# Patient Record
Sex: Male | Born: 1950 | Race: White | Hispanic: No | Marital: Single | Smoking: Former smoker
Health system: Southern US, Community
[De-identification: ages and names within clinical notes are randomized; demographics above are authoritative.]

## PROBLEM LIST (undated history)

## (undated) DIAGNOSIS — I10 Essential (primary) hypertension: Secondary | ICD-10-CM

## (undated) DIAGNOSIS — M25569 Pain in unspecified knee: Secondary | ICD-10-CM

## (undated) DIAGNOSIS — G8929 Other chronic pain: Secondary | ICD-10-CM

## (undated) DIAGNOSIS — M549 Dorsalgia, unspecified: Secondary | ICD-10-CM

## (undated) DIAGNOSIS — M5416 Radiculopathy, lumbar region: Secondary | ICD-10-CM

## (undated) HISTORY — PX: SPLENECTOMY: SUR1306

## (undated) HISTORY — PX: BACK SURGERY: SHX140

---

## 2010-02-01 ENCOUNTER — Emergency Department (HOSPITAL_COMMUNITY): Admission: EM | Admit: 2010-02-01 | Discharge: 2010-02-01 | Payer: Self-pay | Admitting: Emergency Medicine

## 2010-02-02 ENCOUNTER — Ambulatory Visit (HOSPITAL_COMMUNITY): Admission: RE | Admit: 2010-02-02 | Discharge: 2010-02-02 | Payer: Self-pay | Admitting: Emergency Medicine

## 2010-07-28 LAB — PROTIME-INR
INR: 0.99 (ref 0.00–1.49)
Prothrombin Time: 13.3 seconds (ref 11.6–15.2)

## 2010-07-28 LAB — CBC
HCT: 38.7 % — ABNORMAL LOW (ref 39.0–52.0)
Hemoglobin: 13.5 g/dL (ref 13.0–17.0)
MCH: 34.3 pg — ABNORMAL HIGH (ref 26.0–34.0)
MCHC: 34.8 g/dL (ref 30.0–36.0)
MCV: 98.6 fL (ref 78.0–100.0)
Platelets: 182 10*3/uL (ref 150–400)
RBC: 3.93 MIL/uL — ABNORMAL LOW (ref 4.22–5.81)
RDW: 13.3 % (ref 11.5–15.5)
WBC: 7 K/uL (ref 4.0–10.5)

## 2010-07-28 LAB — APTT: aPTT: 28 s (ref 24–37)

## 2011-08-18 ENCOUNTER — Encounter (HOSPITAL_COMMUNITY): Payer: Self-pay

## 2011-08-18 ENCOUNTER — Emergency Department (HOSPITAL_COMMUNITY): Payer: PRIVATE HEALTH INSURANCE

## 2011-08-18 ENCOUNTER — Emergency Department (HOSPITAL_COMMUNITY)
Admission: EM | Admit: 2011-08-18 | Discharge: 2011-08-18 | Disposition: A | Discharge status: Home / Self Care | Payer: PRIVATE HEALTH INSURANCE | Attending: Emergency Medicine | Admitting: Emergency Medicine

## 2011-08-18 DIAGNOSIS — M25462 Effusion, left knee: Secondary | ICD-10-CM

## 2011-08-18 DIAGNOSIS — M25469 Effusion, unspecified knee: Secondary | ICD-10-CM | POA: Insufficient documentation

## 2011-08-18 DIAGNOSIS — M1710 Unilateral primary osteoarthritis, unspecified knee: Secondary | ICD-10-CM | POA: Insufficient documentation

## 2011-08-18 DIAGNOSIS — M1712 Unilateral primary osteoarthritis, left knee: Secondary | ICD-10-CM

## 2011-08-18 DIAGNOSIS — IMO0002 Reserved for concepts with insufficient information to code with codable children: Secondary | ICD-10-CM | POA: Insufficient documentation

## 2011-08-18 MED ORDER — HYDROCODONE-ACETAMINOPHEN 5-325 MG PO TABS: 1.0000 | ORAL_TABLET | ORAL | Status: AC | PRN

## 2011-08-18 NOTE — Discharge Instructions (Signed)

## 2011-08-18 NOTE — ED Notes (Signed)
Pt presents with left knee pain starting today. Pt denies injury.

## 2011-08-19 NOTE — ED Provider Notes (Signed)
History     CSN: [id number]  Arrival date & time 08/18/11  1633   First MD Initiated Contact with Patient 08/18/11 1652      Chief Complaint  Patient presents with  . Knee Pain    (Consider location/radiation/quality/duration/timing/severity/associated sxs/prior treatment) HPI Comments: Patient has been told he has arthritis in his knees.  He believes he has increased pain due to the weather change yesterday.    Patient is a 61 y.o. male presenting with knee pain. The history is provided by the patient.  Knee Pain This is a recurrent problem. The current episode started today. The problem occurs constantly. The problem has been unchanged. Associated symptoms include arthralgias and joint swelling. Pertinent negatives include no abdominal pain, chest pain, chills, congestion, fever, headaches, myalgias, nausea, neck pain, numbness, rash, sore throat or weakness. The symptoms are aggravated by walking and bending. He has tried NSAIDs for the symptoms. The treatment provided no relief.    History reviewed. No pertinent past medical history.  History reviewed. No pertinent past surgical history.  No family history on file.  History  Substance Use Topics  . Smoking status: Never Smoker   . Smokeless tobacco: Not on file  . Alcohol Use: No      Review of Systems  Constitutional: Negative for fever and chills.  HENT: Negative for congestion, sore throat and neck pain.   Eyes: Negative.   Respiratory: Negative for chest tightness and shortness of breath.   Cardiovascular: Negative for chest pain.  Gastrointestinal: Negative for nausea and abdominal pain.  Genitourinary: Negative.   Musculoskeletal: Positive for joint swelling and arthralgias. Negative for myalgias.  Skin: Negative.  Negative for rash and wound.  Neurological: Negative for dizziness, weakness, light-headedness, numbness and headaches.  Hematological: Negative.   Psychiatric/Behavioral: Negative.      Allergies  Review of patient's allergies indicates no known allergies.  Home Medications   Current Outpatient Rx  Name Route Sig Dispense Refill  . IBUPROFEN 200 MG PO TABS Oral Take 400 mg by mouth once as needed. AT BEDTIME FOR PAIN    . ONE-A-DAY MENS PO Oral Take 1 tablet by mouth once a week.    Marland Kitchen HYDROCODONE-ACETAMINOPHEN 5-325 MG PO TABS Oral Take 1 tablet by mouth every 4 (four) hours as needed for pain. 20 tablet 0    BP 154/97  Pulse 62  Temp(Src) 97.5 F (36.4 C) (Oral)  Resp 20  Ht 6\' 6"  (1.981 m)  Wt 175 lb (79.379 kg)  BMI 20.22 kg/m2  SpO2 98%  Physical Exam  Nursing note and vitals reviewed. Constitutional: He is oriented to person, place, and time. He appears well-developed and well-nourished.  HENT:  Head: Normocephalic.  Eyes: Conjunctivae are normal.  Neck: Normal range of motion.  Cardiovascular: Normal rate and intact distal pulses.  Exam reveals no decreased pulses.   Pulses:      Dorsalis pedis pulses are 2+ on the right side, and 2+ on the left side.       Posterior tibial pulses are 2+ on the right side, and 2+ on the left side.  Pulmonary/Chest: Effort normal.  Musculoskeletal: He exhibits edema and tenderness.       Left knee: He exhibits swelling and bony tenderness. He exhibits normal range of motion, no effusion, no deformity, no erythema, no LCL laxity, normal meniscus and no MCL laxity. tenderness found. Lateral joint line tenderness noted.  Neurological: He is alert and oriented to person, place, and time. No sensory  deficit.  Skin: Skin is warm, dry and intact.    ED Course  Procedures (including critical care time)  Labs Reviewed - No data to display Dg Knee Complete 4 Views Left  08/18/2011  *RADIOLOGY REPORT*  Clinical Data: Left knee pain, swelling  LEFT KNEE - COMPLETE 4+ VIEW  Comparison: None.  Findings: Normal alignment.  Tricompartmental osteoarthritis noted, most pronounced in the lateral compartment.  No acute fracture.  Lateral view demonstrates a small joint effusion superior to the patella.  IMPRESSION: Moderate to severe tricompartmental osteoarthritis.  Small joint effusion.  Negative for acute fracture.  Original Report Authenticated By: Judie Petit. Ruel Favors, M.D.     1. Osteoarthritis of left knee   2. Effusion of knee joint, left       MDM  Hydrocodone prescribed.  Referral to Dr. Romeo Apple for further management of sx.        Candis Musa, PA 08/19/11 2302

## 2011-08-19 NOTE — ED Provider Notes (Signed)
Medical screening examination/treatment/procedure(s) were performed by non-physician practitioner and as supervising physician I was immediately available for consultation/collaboration.   Taquisha Phung, MD 08/19/11 2339 

## 2012-01-17 ENCOUNTER — Encounter (HOSPITAL_COMMUNITY): Payer: Self-pay | Admitting: Emergency Medicine

## 2012-01-17 ENCOUNTER — Emergency Department (HOSPITAL_COMMUNITY)
Admission: EM | Admit: 2012-01-17 | Discharge: 2012-01-17 | Disposition: A | Discharge status: Home / Self Care | Payer: PRIVATE HEALTH INSURANCE | Attending: Emergency Medicine | Admitting: Emergency Medicine

## 2012-01-17 ENCOUNTER — Emergency Department (HOSPITAL_COMMUNITY): Payer: PRIVATE HEALTH INSURANCE

## 2012-01-17 DIAGNOSIS — G8929 Other chronic pain: Secondary | ICD-10-CM | POA: Insufficient documentation

## 2012-01-17 DIAGNOSIS — M25569 Pain in unspecified knee: Secondary | ICD-10-CM | POA: Insufficient documentation

## 2012-01-17 DIAGNOSIS — M1710 Unilateral primary osteoarthritis, unspecified knee: Secondary | ICD-10-CM

## 2012-01-17 DIAGNOSIS — M25562 Pain in left knee: Secondary | ICD-10-CM

## 2012-01-17 MED ORDER — NAPROXEN 500 MG PO TABS: 500.0000 mg | ORAL_TABLET | Freq: Two times a day (BID) | ORAL | Status: AC

## 2012-01-17 MED ORDER — OXYCODONE-ACETAMINOPHEN 5-325 MG PO TABS: 1.0000 | ORAL_TABLET | ORAL | Status: AC | PRN

## 2012-01-17 MED ORDER — NAPROXEN 250 MG PO TABS
500.0000 mg | ORAL_TABLET | Freq: Once | ORAL | Status: AC
  Administered 2012-01-17: 500 mg via ORAL
  Filled 2012-01-17: qty 2

## 2012-01-17 MED ORDER — OXYCODONE-ACETAMINOPHEN 5-325 MG PO TABS
1.0000 | ORAL_TABLET | Freq: Once | ORAL | Status: AC
  Administered 2012-01-17: 1 via ORAL
  Filled 2012-01-17: qty 1

## 2012-01-17 NOTE — ED Notes (Signed)
Patient c/o left knee pain since yesterday.  Denies any injury or trauma to left knee.  Patient states hurts to put weight on it.

## 2012-01-17 NOTE — ED Notes (Signed)
Pt presents with left knee pain and noted swelling. Pt states bends down a lot at work and crawls around on knees. Left knee x-ray resulted. NAD noted.

## 2012-01-17 NOTE — ED Provider Notes (Signed)
History     CSN: [id number]  Arrival date & time 01/17/12  1854   First MD Initiated Contact with Patient 01/17/12 1914      Chief Complaint  Patient presents with  . Knee Pain    (Consider location/radiation/quality/duration/timing/severity/associated sxs/prior treatment) HPI Comments: Patient with hx of arthritis of his knee and chronic knee pain, c/o worsening pain to the left knee since the day prior to ed arrival.  States he does a lot of bending and walking at his job and noticed his knee also began swelling after work.  He denies known injury, recent illness or fever,  or recent fall. He also denies back or hip pain, calf pain, discoloration or numbness   The history is provided by the patient.    History reviewed. No pertinent past medical history.  History reviewed. No pertinent past surgical history.  No family history on file.  History  Substance Use Topics  . Smoking status: Never Smoker   . Smokeless tobacco: Not on file  . Alcohol Use: No      Review of Systems  Constitutional: Negative for fever and chills.  Genitourinary: Negative for dysuria and difficulty urinating.  Musculoskeletal: Positive for joint swelling, arthralgias and gait problem. Negative for back pain.  Skin: Negative for color change and wound.  Neurological: Negative for weakness and numbness.  All other systems reviewed and are negative.    Allergies  Review of patient's allergies indicates no known allergies.  Home Medications   Current Outpatient Rx  Name Route Sig Dispense Refill  . IBUPROFEN 200 MG PO TABS Oral Take 400 mg by mouth once as needed. AT BEDTIME FOR PAIN    . ONE-A-DAY MENS PO Oral Take 1 tablet by mouth once a week.      BP 140/79  Pulse 86  Temp 97.5 F (36.4 C) (Oral)  Resp 16  Ht 6\' 6"  (1.981 m)  Wt 265 lb (120.203 kg)  BMI 30.62 kg/m2  SpO2 98%  Physical Exam  Nursing note and vitals reviewed. Constitutional: He is oriented to person, place, and  time. He appears well-developed and well-nourished. No distress.  Cardiovascular: Normal rate, regular rhythm, normal heart sounds and intact distal pulses.   Pulmonary/Chest: Effort normal and breath sounds normal.  Musculoskeletal: He exhibits edema and tenderness.       Left knee: He exhibits swelling and bony tenderness. He exhibits normal range of motion, no effusion, no ecchymosis, no deformity, no laceration, no erythema and no LCL laxity. tenderness found. Medial joint line and lateral joint line tenderness noted.       Legs:      ttp of the anterior left knee.  No erythema, bruising, step-offs, excessive warmth or deformity.  Pain is reproduced with full flexion or full extension.  Slight crepitus with extension.  DP pulses brisk, sensation intact.,  No calf pain or swelling.  Neg Homan's sign  Neurological: He is alert and oriented to person, place, and time. He exhibits normal muscle tone. Coordination normal.  Skin: Skin is warm and dry. No erythema.    ED Course  Procedures (including critical care time)  Labs Reviewed - No data to display Dg Knee Complete 4 Views Left  01/17/2012  *RADIOLOGY REPORT*  Clinical Data: Left knee pain for 2 days.  No known injury.  LEFT KNEE - COMPLETE 4+ VIEW  Comparison: Radiographs 08/18/2011.  Findings: Advanced tricompartmental degenerative changes are again noted with bone on bone apposition laterally.  There is  a small knee joint effusion.  No acute fracture or dislocation is seen.  IMPRESSION: Stable examination with tricompartmental degenerative changes and joint effusion.  No acute osseous findings.   Original Report Authenticated By: Gerrianne Scale, M.D.         MDM    Previous ED chart reviewed.  Pt seen here previously for same.   Knee immob applied.  Pain improved.  Remains NV intact.  Pt agrees to close f/u with Dr. Romeo Apple.  No excessive warmth, erythema, step-offs or obvious effusion.  Likely exacerbation of OA of the knee.   Doubt septic joint.  Prescribed: Naprosyn Norco #20     Tylar Amborn L. [place] Rios, [place] 01/18/12 1318

## 2012-01-18 NOTE — ED Provider Notes (Signed)
Medical screening examination/treatment/procedure(s) were performed by non-physician practitioner and as supervising physician I was immediately available for consultation/collaboration.  Peyson Delao T Yazlyn Wentzel, MD 01/18/12 1504 

## 2013-04-11 ENCOUNTER — Encounter (HOSPITAL_COMMUNITY): Payer: Self-pay | Admitting: Emergency Medicine

## 2013-04-11 ENCOUNTER — Emergency Department (HOSPITAL_COMMUNITY): Payer: PRIVATE HEALTH INSURANCE

## 2013-04-11 ENCOUNTER — Emergency Department (HOSPITAL_COMMUNITY)
Admission: EM | Admit: 2013-04-11 | Discharge: 2013-04-11 | Disposition: A | Discharge status: Home / Self Care | Payer: PRIVATE HEALTH INSURANCE | Attending: Emergency Medicine | Admitting: Emergency Medicine

## 2013-04-11 DIAGNOSIS — M5416 Radiculopathy, lumbar region: Secondary | ICD-10-CM

## 2013-04-11 DIAGNOSIS — Z9889 Other specified postprocedural states: Secondary | ICD-10-CM | POA: Insufficient documentation

## 2013-04-11 DIAGNOSIS — IMO0002 Reserved for concepts with insufficient information to code with codable children: Secondary | ICD-10-CM | POA: Insufficient documentation

## 2013-04-11 DIAGNOSIS — Z87891 Personal history of nicotine dependence: Secondary | ICD-10-CM | POA: Insufficient documentation

## 2013-04-11 MED ORDER — CYCLOBENZAPRINE HCL 5 MG PO TABS: 5.0000 mg | ORAL_TABLET | Freq: Three times a day (TID) | ORAL | Status: DC | PRN

## 2013-04-11 MED ORDER — HYDROMORPHONE HCL PF 1 MG/ML IJ SOLN
1.0000 mg | Freq: Once | INTRAMUSCULAR | Status: AC
  Administered 2013-04-11: 1 mg via INTRAMUSCULAR
  Filled 2013-04-11: qty 1

## 2013-04-11 MED ORDER — OXYCODONE-ACETAMINOPHEN 5-325 MG PO TABS: 1.0000 | ORAL_TABLET | ORAL | Status: DC | PRN

## 2013-04-11 NOTE — ED Notes (Signed)
Pt states pain in left lower back started 1 x week prior and was initially mild but has progressed to more intense pain and is now radiating towards left anterior thigh.  NAD, AO.

## 2013-04-11 NOTE — ED Provider Notes (Signed)
CSN: [id number]     Arrival date & time 04/11/13  1245 History   First MD Initiated Contact with Patient 04/11/13 1438     Chief Complaint  Patient presents with  . Back Pain   (Consider location/radiation/quality/duration/timing/severity/associated sxs/prior Treatment) HPI Comments: Chris Hicks is a 62 y.o. Male with a one week history of low back pain with radiation through his left buttock and wrapping around his anterior thigh.  His pain was mild with gradually progressive worsening the past several days.  He has occasional low back pain with a history of lumbar disk surgery 10 years ago at [facility] which generally improves with otc pain relievers.  Ibuprofen has not helped today.  He denies new injury.  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  He denies weakness or numbness in his lower extremities with the pain radiating to his mid left thigh.  He denies urinary or fecal incontinence or retention.     The history is provided by the patient.    History reviewed. No pertinent past medical history. Past Surgical History  Procedure Laterality Date  . Back surgery     History reviewed. No pertinent family history. History  Substance Use Topics  . Smoking status: Former Smoker -- 20 years  . Smokeless tobacco: Not on file  . Alcohol Use: No    Review of Systems  Constitutional: Negative for fever.  Respiratory: Negative for shortness of breath.   Cardiovascular: Negative for chest pain and leg swelling.  Gastrointestinal: Negative for abdominal pain, constipation and abdominal distention.  Genitourinary: Negative for dysuria, urgency, frequency, flank pain and difficulty urinating.  Musculoskeletal: Positive for back pain. Negative for gait problem and joint swelling.  Skin: Negative for rash.  Neurological: Negative for weakness and numbness.    Allergies  Review of patient's allergies indicates no  known allergies.  Home Medications   Current Outpatient Rx  Name  Route  Sig  Dispense  Refill  . ibuprofen (ADVIL,MOTRIN) 200 MG tablet   Oral   Take 400 mg by mouth once as needed. AT BEDTIME FOR PAIN         . Multiple Vitamin (ONE-A-DAY MENS PO)   Oral   Take 1 tablet by mouth once a week.         . cyclobenzaprine (FLEXERIL) 5 MG tablet   Oral   Take 1 tablet (5 mg total) by mouth 3 (three) times daily as needed for muscle spasms.   15 tablet   0   . oxyCODONE-acetaminophen (PERCOCET/ROXICET) 5-325 MG per tablet   Oral   Take 1 tablet by mouth every 4 (four) hours as needed for severe pain.   24 tablet   0    BP 146/89  Pulse 72  Temp(Src) 97.9 F (36.6 C) (Oral)  Resp 16  Ht 6\' 6"  (1.981 m)  Wt 265 lb (120.203 kg)  BMI 30.63 kg/m2  SpO2 97% Physical Exam  Nursing note and vitals reviewed. Constitutional: He appears well-developed and well-nourished.  HENT:  Head: Normocephalic.  Eyes: Conjunctivae are normal.  Neck: Normal range of motion. Neck supple.  Cardiovascular: Normal rate and intact distal pulses.   Pedal pulses normal.  Pulmonary/Chest: Effort normal.  Abdominal: Soft. Bowel sounds are normal. He exhibits no distension and no mass.  Musculoskeletal: Normal range of motion. He exhibits tenderness. He exhibits no edema.       Lumbar back: He  exhibits tenderness. He exhibits no bony tenderness, no swelling, no edema and no spasm.       Back:  Neurological: He is alert. He has normal strength. He displays no atrophy and no tremor. No sensory deficit. Gait normal.  Reflex Scores:      Patellar reflexes are 2+ on the right side and 2+ on the left side.      Achilles reflexes are 2+ on the right side and 2+ on the left side. No strength deficit noted in hip and knee flexor and extensor muscle groups.  Ankle flexion and extension intact.  Skin: Skin is warm and dry.  Psychiatric: He has a normal mood and affect.    ED Course  Procedures  (including critical care time) Labs Review Labs Reviewed - No data to display Imaging Review Dg Lumbar Spine Complete  04/11/2013   CLINICAL DATA:  One week history of left lower back pain now radiating into anterior thigh  EXAM: LUMBAR SPINE - COMPLETE 4+ VIEW  COMPARISON:  None.  FINDINGS: Frontal, lateral and bilateral oblique radiographs of the lumbar spine demonstrate no acute fracture or malalignment. Chronic appearing compression fracture of T11 with greater than 50% height loss anteriorly in focal kyphosis. Multilevel degenerative disc disease is noted. Facet arthropathy at L4-L5 and L5-S1. Prominence right lateral osteophyte formation at L1-L2. No evidence of anterolisthesis. Visualized bowel gas pattern is unremarkable. Atherosclerotic calcifications noted in the aorta.  IMPRESSION: 1. Chronic appearing compression fracture of T11 with greater than 50% height loss anteriorly. 2. Multilevel degenerative disc disease and lower lumbar facet arthropathy. 3. Aortic atherosclerosis.   Electronically Signed   By: Malachy Moan M.D.   On: 04/11/2013 15:22    EKG Interpretation   None       MDM   1. Left lumbar radiculopathy    Patients labs and/or radiological studies were viewed and considered during the medical decision making and disposition process. Pt reports thoracic compression fracture is known and an old injury.  He was given dilaudid 1 mg IM with significant improvement in pain.  He was prescribed oxycodone, flexeril.  Encouraged heating pad tx.  Recheck by pcp if not improved over the next 5 day.    No neuro deficit on exam or by history to suggest emergent or surgical presentation.  Also discussed worsened sx that should prompt immediate re-evaluation including distal weakness, bowel/bladder retention/incontinence.          Burgess Amor, PA-C 04/11/13 1659

## 2013-04-11 NOTE — ED Notes (Signed)
Pain lt side of low back with radiation down lt leg,  Onset 1 week ago, but worse last few days.  Alert.

## 2013-04-13 NOTE — ED Provider Notes (Signed)
Medical screening examination/treatment/procedure(s) were performed by non-physician practitioner and as supervising physician I was immediately available for consultation/collaboration.  EKG Interpretation   None        Shavaun Osterloh, MD 04/13/13 0757 

## 2013-04-25 ENCOUNTER — Emergency Department (HOSPITAL_COMMUNITY)
Admission: EM | Admit: 2013-04-25 | Discharge: 2013-04-25 | Disposition: A | Discharge status: Home / Self Care | Payer: PRIVATE HEALTH INSURANCE | Attending: Emergency Medicine | Admitting: Emergency Medicine

## 2013-04-25 ENCOUNTER — Encounter (HOSPITAL_COMMUNITY): Payer: Self-pay | Admitting: Emergency Medicine

## 2013-04-25 DIAGNOSIS — Z79899 Other long term (current) drug therapy: Secondary | ICD-10-CM | POA: Insufficient documentation

## 2013-04-25 DIAGNOSIS — R609 Edema, unspecified: Secondary | ICD-10-CM

## 2013-04-25 DIAGNOSIS — Z87891 Personal history of nicotine dependence: Secondary | ICD-10-CM | POA: Insufficient documentation

## 2013-04-25 DIAGNOSIS — R350 Frequency of micturition: Secondary | ICD-10-CM | POA: Insufficient documentation

## 2013-04-25 DIAGNOSIS — N419 Inflammatory disease of prostate, unspecified: Secondary | ICD-10-CM | POA: Insufficient documentation

## 2013-04-25 LAB — BASIC METABOLIC PANEL
BUN: 20 mg/dL (ref 6–23)
CO2: 24 mEq/L (ref 19–32)
Chloride: 102 mEq/L (ref 96–112)
Creatinine, Ser: 0.82 mg/dL (ref 0.50–1.35)
GFR calc Af Amer: >90 mL/min (ref >90)

## 2013-04-25 LAB — URINALYSIS, ROUTINE W REFLEX MICROSCOPIC
Bilirubin Urine: NEGATIVE
Glucose, UA: NEGATIVE mg/dL
Hgb urine dipstick: NEGATIVE
Ketones, ur: NEGATIVE mg/dL
Leukocytes, UA: NEGATIVE
pH: 5.5 (ref 5.0–8.0)

## 2013-04-25 LAB — CBC WITH DIFFERENTIAL/PLATELET
Basophils Relative: 1 % (ref 0–1)
Eosinophils Relative: 2 % (ref 0–5)
HCT: 37.8 % — ABNORMAL LOW (ref 39.0–52.0)
Hemoglobin: 13 g/dL (ref 13.0–17.0)
MCHC: 34.4 g/dL (ref 30.0–36.0)
MCV: 101.1 fL — ABNORMAL HIGH (ref 78.0–100.0)
Monocytes Absolute: 1.1 10*3/uL — ABNORMAL HIGH (ref 0.1–1.0)
Monocytes Relative: 20 % — ABNORMAL HIGH (ref 3–12)
Neutro Abs: 2.7 10*3/uL (ref 1.7–7.7)

## 2013-04-25 MED ORDER — CIPROFLOXACIN HCL 500 MG PO TABS: 500.0000 mg | ORAL_TABLET | Freq: Two times a day (BID) | ORAL | Status: DC

## 2013-04-25 MED ORDER — FUROSEMIDE 20 MG PO TABS: 20.0000 mg | ORAL_TABLET | Freq: Every day | ORAL | Status: DC

## 2013-04-25 NOTE — ED Notes (Addendum)
Difficulty urinating for 2 days,  Swelling of feet intermittently for 1 year.  No fever , chills.   Has burn to rt forearm from welding.

## 2013-04-25 NOTE — ED Provider Notes (Signed)
CSN: [id number]     Arrival date & time 04/25/13  1359 History   First MD Initiated Contact with Patient 04/25/13 1416     No chief complaint on file.  (Consider location/radiation/quality/duration/timing/severity/associated sxs/prior Treatment) The history is provided by the patient.   patient's had some difficulty urinating for the last few days. States she's had some trouble going up and no pain with joint. He states he thinks the volumes are about normal. No chest pain or trouble breathing. No fevers. He states he's had some increased swelling in his legs 2. He was recently seen for back pain, he states this has improved somewhat with pain medicines muscle relaxants. No numbness or weakness. He states his left knee will occasionally give out on him, however he states this is not unusual.  History reviewed. No pertinent past medical history. Past Surgical History  Procedure Laterality Date  . Back surgery    . Splenectomy     History reviewed. No pertinent family history. History  Substance Use Topics  . Smoking status: Former Smoker -- 20 years  . Smokeless tobacco: Not on file  . Alcohol Use: No    Review of Systems  Constitutional: Negative for activity change and appetite change.  Eyes: Negative for pain.  Respiratory: Negative for chest tightness and shortness of breath.   Cardiovascular: Positive for leg swelling. Negative for chest pain.  Gastrointestinal: Negative for nausea, vomiting, abdominal pain and diarrhea.  Genitourinary: Positive for frequency and difficulty urinating. Negative for flank pain.  Musculoskeletal: Negative for back pain and neck stiffness.  Skin: Negative for rash.  Neurological: Negative for weakness, numbness and headaches.  Psychiatric/Behavioral: Negative for behavioral problems.    Allergies  Review of patient's allergies indicates no known allergies.  Home Medications   Current Outpatient Rx  Name  Route  Sig  Dispense  Refill  .  acetaminophen (TYLENOL) 650 MG CR tablet   Oral   Take 1,950 mg by mouth daily.         . cyclobenzaprine (FLEXERIL) 5 MG tablet   Oral   Take 1 tablet (5 mg total) by mouth 3 (three) times daily as needed for muscle spasms.   15 tablet   0   . oxyCODONE-acetaminophen (PERCOCET/ROXICET) 5-325 MG per tablet   Oral   Take 1 tablet by mouth every 4 (four) hours as needed for severe pain.   24 tablet   0    BP 145/96  Pulse 82  Temp(Src) 97.6 F (36.4 C) (Oral)  Resp 18  Ht 6\' 6"  (1.981 m)  Wt 265 lb (120.203 kg)  BMI 30.63 kg/m2  SpO2 99% Physical Exam  Nursing note and vitals reviewed. Constitutional: He is oriented to person, place, and time. He appears well-developed and well-nourished.  HENT:  Head: Normocephalic and atraumatic.  Eyes: EOM are normal. Pupils are equal, round, and reactive to light.  Neck: Normal range of motion. Neck supple.  Cardiovascular: Normal rate, regular rhythm and normal heart sounds.   No murmur heard. Pulmonary/Chest: Effort normal and breath sounds normal.  Abdominal: Soft. Bowel sounds are normal. He exhibits no distension and no mass. There is no tenderness. There is no rebound and no guarding.  Genitourinary:  Perineal tenderness.  Musculoskeletal: Normal range of motion. He exhibits edema.  Bilateral lower extremity pitting edema. Some excoriations from scratching on his bilateral lower legs.  Neurological: He is alert and oriented to person, place, and time. No cranial nerve deficit.  Skin: Skin is  warm and dry.  Psychiatric: He has a normal mood and affect.    ED Course  Procedures (including critical care time) Labs Review Labs Reviewed  URINALYSIS, ROUTINE W REFLEX MICROSCOPIC  CBC WITH DIFFERENTIAL  BASIC METABOLIC PANEL   Imaging Review No results found.  EKG Interpretation   None       MDM  No diagnosis found. Patient with some dysuria. Peroneal tenderness. May need treatment as prostatitis. Blood work is  pending at this time, as is the urine. Care will be turned over to Dr. Rosalia Hammers. Patient may need short dose of Lasix for some of his peripheral edema. He does not appear to be in a severe CHF.    Juliet Rude. Rubin Payor, MD 04/25/13 [phone]

## 2014-07-02 ENCOUNTER — Encounter (HOSPITAL_COMMUNITY): Payer: Self-pay | Admitting: Emergency Medicine

## 2014-07-02 ENCOUNTER — Emergency Department (HOSPITAL_COMMUNITY)
Admission: EM | Admit: 2014-07-02 | Discharge: 2014-07-03 | Disposition: A | Discharge status: Home / Self Care | Payer: 59 | Attending: Emergency Medicine | Admitting: Emergency Medicine

## 2014-07-02 ENCOUNTER — Emergency Department (HOSPITAL_COMMUNITY): Payer: 59

## 2014-07-02 DIAGNOSIS — Y9289 Other specified places as the place of occurrence of the external cause: Secondary | ICD-10-CM | POA: Diagnosis not present

## 2014-07-02 DIAGNOSIS — Y9389 Activity, other specified: Secondary | ICD-10-CM | POA: Diagnosis not present

## 2014-07-02 DIAGNOSIS — S92512A Displaced fracture of proximal phalanx of left lesser toe(s), initial encounter for closed fracture: Secondary | ICD-10-CM | POA: Diagnosis not present

## 2014-07-02 DIAGNOSIS — X58XXXA Exposure to other specified factors, initial encounter: Secondary | ICD-10-CM | POA: Diagnosis not present

## 2014-07-02 DIAGNOSIS — Z87891 Personal history of nicotine dependence: Secondary | ICD-10-CM | POA: Diagnosis not present

## 2014-07-02 DIAGNOSIS — S93402A Sprain of unspecified ligament of left ankle, initial encounter: Secondary | ICD-10-CM | POA: Diagnosis not present

## 2014-07-02 DIAGNOSIS — G8929 Other chronic pain: Secondary | ICD-10-CM | POA: Diagnosis not present

## 2014-07-02 DIAGNOSIS — M8739 Other secondary osteonecrosis, multiple sites: Secondary | ICD-10-CM | POA: Insufficient documentation

## 2014-07-02 DIAGNOSIS — Y998 Other external cause status: Secondary | ICD-10-CM | POA: Diagnosis not present

## 2014-07-02 DIAGNOSIS — S92502A Displaced unspecified fracture of left lesser toe(s), initial encounter for closed fracture: Secondary | ICD-10-CM

## 2014-07-02 DIAGNOSIS — Z79899 Other long term (current) drug therapy: Secondary | ICD-10-CM | POA: Insufficient documentation

## 2014-07-02 DIAGNOSIS — S99912A Unspecified injury of left ankle, initial encounter: Secondary | ICD-10-CM | POA: Diagnosis present

## 2014-07-02 HISTORY — DX: Pain in unspecified knee: M25.569

## 2014-07-02 HISTORY — DX: Dorsalgia, unspecified: M54.9

## 2014-07-02 HISTORY — DX: Radiculopathy, lumbar region: M54.16

## 2014-07-02 HISTORY — DX: Other chronic pain: G89.29

## 2014-07-02 NOTE — ED Provider Notes (Signed)
CSN: [id number]     Arrival date & time 07/02/14  2159 History   First MD Initiated Contact with Patient 07/02/14 2318     No chief complaint on file.    (Consider location/radiation/quality/duration/timing/severity/associated sxs/prior Treatment) HPI   Chris Hicks is a [age] y.o. male who presents to the Emergency Department complaining of left ankle and foot pain that began suddenly this afternoon when his foot went through a wooden board.  He complains of continued pain, and swelling of the foot and lateral ankle.  Pain is worse with weight bearing.  He has not tried any therapies prior to ED arrival.  He denies pain or swelling above the ankle.   Past Medical History  Diagnosis Date  . Chronic knee pain   . Chronic back pain   . Lumbar radiculopathy    Past Surgical History  Procedure Laterality Date  . Back surgery    . Splenectomy     History reviewed. No pertinent family history. History  Substance Use Topics  . Smoking status: Former Smoker -- 20 years  . Smokeless tobacco: Not on file  . Alcohol Use: No    Review of Systems  Constitutional: Negative for fever and chills.  Musculoskeletal: Positive for joint swelling and arthralgias (left ankle and foot pain).  Skin: Negative for color change and wound.  Neurological: Negative for weakness, numbness and headaches.  All other systems reviewed and are negative.     Allergies  Review of patient's allergies indicates no known allergies.  Home Medications   Prior to Admission medications   Medication Sig Start Date End Date Taking? Authorizing Provider  acetaminophen (TYLENOL) 650 MG CR tablet Take 1,950 mg by mouth daily.    Historical Provider, MD  ciprofloxacin (CIPRO) 500 MG tablet Take 1 tablet (500 mg total) by mouth 2 (two) times daily. 04/25/13   Juliet RudeNathan R. Pickering, MD  cyclobenzaprine (FLEXERIL) 5 MG tablet Take 1 tablet (5 mg total) by mouth 3 (three) times daily as needed for muscle spasms. 04/11/13    Burgess AmorJulie Idol, PA-C  furosemide (LASIX) 20 MG tablet Take 1 tablet (20 mg total) by mouth daily. 04/25/13   Juliet RudeNathan R. Pickering, MD  oxyCODONE-acetaminophen (PERCOCET/ROXICET) 5-325 MG per tablet Take 1 tablet by mouth every 4 (four) hours as needed for severe pain. 04/11/13   Burgess AmorJulie Idol, PA-C   BP 116/66 mmHg  Pulse 60  Temp(Src) 97.8 F (36.6 C) (Oral)  Resp 20  Ht 6\' 6"  (1.981 m)  Wt 266 lb (120.657 kg)  BMI 30.75 kg/m2  SpO2 97% Physical Exam  Constitutional: He is oriented to person, place, and time. He appears well-developed and well-nourished. No distress.  HENT:  Head: Normocephalic and atraumatic.  Cardiovascular: Normal rate, regular rhythm and intact distal pulses.   Pulmonary/Chest: Effort normal and breath sounds normal. No respiratory distress.  Musculoskeletal: He exhibits edema and tenderness.  ttp of the lateral left ankle, mild to moderate STS present.  Mild ecchymosis to fourth and fifth toes.  DP pulse is brisk,distal sensation intact.  No erythema, abrasion, or bony deformity.  No tenderness proximal to the ankle.  Compartments are soft.   Neurological: He is alert and oriented to person, place, and time. He exhibits normal muscle tone. Coordination normal.  Skin: Skin is warm and dry.  Nursing note and vitals reviewed.   ED Course  Procedures (including critical care time) Labs Review Labs Reviewed - No data to display  Imaging Review Dg Ankle Complete Left  07/02/2014   CLINICAL DATA:  Left ankle injury today with pain and swelling. Initial encounter.  EXAM: LEFT ANKLE COMPLETE - 3+ VIEW  COMPARISON:  None.  FINDINGS: Soft tissues about the left ankle are swollen. No fracture, dislocation or other acute bony or joint abnormality is identified. Mild talonavicular degenerative disease in small calcaneal spur are noted.  IMPRESSION: Soft tissue swelling without underlying acute bony or joint abnormality.   Electronically Signed   By: Drusilla Kanner M.D.   On:  07/02/2014 22:47   Dg Foot Complete Left  07/02/2014   CLINICAL DATA:  Left foot injury today with pain. Initial encounter.  EXAM: LEFT FOOT - COMPLETE 3+ VIEW  COMPARISON:  None.  FINDINGS: The patient has an intra-articular fracture through the base of the proximal phalanx of the left little toe. The fracture is mildly distracted. No other acute bony or joint abnormality is seen. First MTP osteoarthritis is noted.  IMPRESSION: Mildly distracted intra-articular fracture base of the proximal phalanx of the left little toe.   Electronically Signed   By: Drusilla Kanner M.D.   On: 07/02/2014 22:46     EKG Interpretation None      MDM   Final diagnoses:  Fracture of fifth toe, left, closed, initial encounter  Ankle sprain, left, initial encounter    Pt with fx of proximal phalanx of the left fifth toe, likely sprain of the ankle.  NV intact.  Toes were buddy taped,  ASO splint applied.  Pain improved.   Pt has crutches at home.  He agrees to RICE therapy and orthopedic f/u with Dr. Romeo Apple at patient's request.      Azaryah Heathcock L. Taegan Haider, PA-C 07/03/14 [id number]  Ward Givens, MD 07/03/14 [phone]

## 2014-07-02 NOTE — ED Notes (Signed)
Stepped into building and lt foot went thru floor, Pain lt foot and ankle

## 2014-07-03 MED ORDER — HYDROCODONE-ACETAMINOPHEN 5-325 MG PO TABS: ORAL_TABLET | ORAL | Status: DC

## 2014-07-03 NOTE — ED Notes (Signed)
Discharge instructions given, pt demonstrated teach back and verbal understanding. No concerns voiced.  

## 2014-07-03 NOTE — Discharge Instructions (Signed)
Ankle Sprain An ankle sprain is an injury to the strong, fibrous tissues (ligaments) that hold your ankle bones together.  HOME CARE   Put ice on your ankle for 1-2 days or as told by your doctor.  Put ice in a plastic bag.  Place a towel between your skin and the bag.  Leave the ice on for 15-20 minutes at a time, every 2 hours while you are awake.  Only take medicine as told by your doctor.  Raise (elevate) your injured ankle above the level of your heart as much as possible for 2-3 days.  Use crutches if your doctor tells you to. Slowly put your own weight on the affected ankle. Use the crutches until you can walk without pain.  If you have a plaster splint:  Do not rest it on anything harder than a pillow for 24 hours.  Do not put weight on it.  Do not get it wet.  Take it off to shower or bathe.  If given, use an elastic wrap or support stocking for support. Take the wrap off if your toes lose feeling (numb), tingle, or turn cold or blue.  If you have an air splint:  Add or let out air to make it comfortable.  Take it off at night and to shower and bathe.  Wiggle your toes and move your ankle up and down often while you are wearing it. GET HELP IF:  You have rapidly increasing bruising or puffiness (swelling).  Your toes feel very cold.  You lose feeling in your foot.  Your medicine does not help your pain. GET HELP RIGHT AWAY IF:   Your toes lose feeling (numb) or turn blue.  You have severe pain that is increasing. MAKE SURE YOU:   Understand these instructions.  Will watch your condition.  Will get help right away if you are not doing well or get worse. Document Released: 10/18/2007 Document Revised: 09/15/2013 Document Reviewed: 11/13/2011 [facility] Patient Information 2015 [place], [place]. This information is not intended to replace advice given to you by your health care provider. Make sure you discuss any questions you have with your health care  provider.  Buddy Taping of Toes We have taped your toes together to keep them from moving. This is called "buddy taping" since we used a part of your own body to keep the injured part still. We placed soft padding between your toes to keep them from rubbing against each other. Buddy taping will help with healing and to reduce pain. Keep your toes buddy taped together for as long as directed by your caregiver. HOME CARE INSTRUCTIONS   Raise your injured area above the level of your heart while sitting or lying down. Prop it up with pillows.  An ice pack used every twenty minutes, while awake, for the first one to two days may be helpful. Put ice in a plastic bag and put a towel between the bag and your skin.  Watch for signs that the taping is too tight. These signs may be:  Numbness of your taped toes.  Coolness of your taped toes.  Color change in the area beyond the tape.  Increased pain.  If you have any of these signs, loosen or rewrap the tape. If you need to loosen or rewrap the buddy tape, make sure you use the padding again. SEEK IMMEDIATE MEDICAL CARE IF:   You have worse pain, swelling, inflammation (soreness), drainage or bleeding after you rewrap the tape.  Any new problems occur. MAKE SURE YOU:   Understand these instructions.  Will watch your condition.  Will get help right away if you are not doing well or get worse. Document Released: 02/03/2004 Document Revised: 07/24/2011 Document Reviewed: 04/28/2008 [facility] Patient Information 2015 [place], [place]. This information is not intended to replace advice given to you by your health care provider. Make sure you discuss any questions you have with your health care provider.  Toe Fracture A toe fracture is a break in the bone of a toe. It may take 6 to 8 weeks for the toe injury to heal. HOME CARE  "Buddy taping" is taping the broken toe to the toe next to it. Leave the toes taped together for at least 1 week or as  told by your doctor. Change the tape after bathing. Always use a small piece of gauze or cotton between the toes when taping them together.  Put ice on the injured area.  Put ice in a plastic bag.  Place a towel between your skin and the bag.  Leave the ice on for 15-20 minutes, 03-04 times a day.  After the first 2 days, put heat on the injured area. Use heat for the next 2 to 3 days. Put a heating pad on the foot or soak the foot in warm water as told by your doctor.  Keep the foot raised (elevated) above the level of your heart.  Wear sturdy, supportive shoes. The shoes should not pinch the toes or fit tightly against the toes.  Use a cast shoe (if prescribed) if the foot is very puffy (swollen).  Use crutches if you have pain or it hurts too much to walk.  Only take medicine as told by your doctor.  Follow up with your doctor as told. GET HELP RIGHT AWAY IF:   There is pain or puffiness that is not helped by medicine.  The pain does not get better after 1 week.  The toe is cold when the others are warm.  The toe loses feeling (numb) or turns white.  The toe becomes hot and red (inflamed). MAKE SURE YOU:   Understand these instructions.  Will watch this condition.  Will get help right away if you are not doing well or get worse. Document Released: 10/18/2007 Document Revised: 07/24/2011 Document Reviewed: 09/24/2009 [facility] Patient Information 2015 [place], [place]. This information is not intended to replace advice given to you by your health care provider. Make sure you discuss any questions you have with your health care provider.

## 2014-07-07 MED FILL — Hydrocodone-Acetaminophen Tab 5-325 MG: ORAL | Qty: 6 | Status: AC

## 2014-11-03 ENCOUNTER — Other Ambulatory Visit (HOSPITAL_COMMUNITY): Payer: Self-pay | Admitting: Nurse Practitioner

## 2014-11-03 DIAGNOSIS — R7989 Other specified abnormal findings of blood chemistry: Secondary | ICD-10-CM

## 2014-11-03 DIAGNOSIS — R945 Abnormal results of liver function studies: Secondary | ICD-10-CM

## 2014-11-03 DIAGNOSIS — B182 Chronic viral hepatitis C: Secondary | ICD-10-CM

## 2015-05-03 ENCOUNTER — Emergency Department (HOSPITAL_COMMUNITY): Payer: BLUE CROSS/BLUE SHIELD

## 2015-05-03 ENCOUNTER — Encounter (HOSPITAL_COMMUNITY): Payer: Self-pay | Admitting: *Deleted

## 2015-05-03 ENCOUNTER — Emergency Department (HOSPITAL_COMMUNITY)
Admission: EM | Admit: 2015-05-03 | Discharge: 2015-05-03 | Disposition: A | Discharge status: Home / Self Care | Payer: BLUE CROSS/BLUE SHIELD | Attending: Emergency Medicine | Admitting: Emergency Medicine

## 2015-05-03 DIAGNOSIS — G8929 Other chronic pain: Secondary | ICD-10-CM | POA: Insufficient documentation

## 2015-05-03 DIAGNOSIS — R609 Edema, unspecified: Secondary | ICD-10-CM

## 2015-05-03 DIAGNOSIS — J3489 Other specified disorders of nose and nasal sinuses: Secondary | ICD-10-CM | POA: Diagnosis not present

## 2015-05-03 DIAGNOSIS — R079 Chest pain, unspecified: Secondary | ICD-10-CM | POA: Insufficient documentation

## 2015-05-03 DIAGNOSIS — R110 Nausea: Secondary | ICD-10-CM | POA: Diagnosis not present

## 2015-05-03 DIAGNOSIS — I10 Essential (primary) hypertension: Secondary | ICD-10-CM | POA: Insufficient documentation

## 2015-05-03 DIAGNOSIS — R0602 Shortness of breath: Secondary | ICD-10-CM | POA: Diagnosis not present

## 2015-05-03 DIAGNOSIS — R011 Cardiac murmur, unspecified: Secondary | ICD-10-CM | POA: Diagnosis not present

## 2015-05-03 DIAGNOSIS — D649 Anemia, unspecified: Secondary | ICD-10-CM | POA: Diagnosis not present

## 2015-05-03 DIAGNOSIS — Z79899 Other long term (current) drug therapy: Secondary | ICD-10-CM | POA: Diagnosis not present

## 2015-05-03 DIAGNOSIS — R600 Localized edema: Secondary | ICD-10-CM | POA: Insufficient documentation

## 2015-05-03 DIAGNOSIS — Z87891 Personal history of nicotine dependence: Secondary | ICD-10-CM | POA: Diagnosis not present

## 2015-05-03 DIAGNOSIS — D539 Nutritional anemia, unspecified: Secondary | ICD-10-CM

## 2015-05-03 DIAGNOSIS — Z792 Long term (current) use of antibiotics: Secondary | ICD-10-CM | POA: Insufficient documentation

## 2015-05-03 DIAGNOSIS — M7989 Other specified soft tissue disorders: Secondary | ICD-10-CM | POA: Diagnosis present

## 2015-05-03 HISTORY — DX: Essential (primary) hypertension: I10

## 2015-05-03 LAB — COMPREHENSIVE METABOLIC PANEL
ALT: 47 U/L (ref 17–63)
ANION GAP: 8 (ref 5–15)
AST: 73 U/L — ABNORMAL HIGH (ref 15–41)
Albumin: 3.1 g/dL — ABNORMAL LOW (ref 3.5–5.0)
Alkaline Phosphatase: 103 U/L (ref 38–126)
BUN: 14 mg/dL (ref 6–20)
CALCIUM: 8.7 mg/dL — AB (ref 8.9–10.3)
CO2: 20 mmol/L — ABNORMAL LOW (ref 22–32)
CREATININE: 0.74 mg/dL (ref 0.61–1.24)
Chloride: 109 mmol/L (ref 101–111)
Glucose, Bld: 112 mg/dL — ABNORMAL HIGH (ref 65–99)
Potassium: 4 mmol/L (ref 3.5–5.1)
SODIUM: 137 mmol/L (ref 135–145)
Total Bilirubin: 0.5 mg/dL (ref 0.3–1.2)
Total Protein: 7.5 g/dL (ref 6.5–8.1)

## 2015-05-03 LAB — CBC WITH DIFFERENTIAL/PLATELET
BASOS ABS: 0.1 10*3/uL (ref 0.0–0.1)
Basophils Relative: 2 %
EOS ABS: 0.3 10*3/uL (ref 0.0–0.7)
EOS PCT: 6 %
HCT: 36.1 % — ABNORMAL LOW (ref 39.0–52.0)
Hemoglobin: 12.1 g/dL — ABNORMAL LOW (ref 13.0–17.0)
Lymphocytes Relative: 33 %
Lymphs Abs: 1.7 10*3/uL (ref 0.7–4.0)
MCH: 33.9 pg (ref 26.0–34.0)
MCHC: 33.5 g/dL (ref 30.0–36.0)
MCV: 101.1 fL — ABNORMAL HIGH (ref 78.0–100.0)
Monocytes Absolute: 1.2 10*3/uL — ABNORMAL HIGH (ref 0.1–1.0)
Monocytes Relative: 25 %
NEUTROS PCT: 34 %
Neutro Abs: 1.8 10*3/uL (ref 1.7–7.7)
PLATELETS: 191 10*3/uL (ref 150–400)
RBC: 3.57 MIL/uL — AB (ref 4.22–5.81)
RDW: 15.4 % (ref 11.5–15.5)
WBC: 5 10*3/uL (ref 4.0–10.5)

## 2015-05-03 LAB — BRAIN NATRIURETIC PEPTIDE: B Natriuretic Peptide: 86 pg/mL (ref 0.0–100.0)

## 2015-05-03 LAB — URINALYSIS, ROUTINE W REFLEX MICROSCOPIC
Bilirubin Urine: NEGATIVE
Glucose, UA: NEGATIVE mg/dL
Hgb urine dipstick: NEGATIVE
Ketones, ur: NEGATIVE mg/dL
LEUKOCYTES UA: NEGATIVE
NITRITE: NEGATIVE
PROTEIN: NEGATIVE mg/dL
Specific Gravity, Urine: 1.015 (ref 1.005–1.030)
pH: 5 (ref 5.0–8.0)

## 2015-05-03 LAB — TROPONIN I

## 2015-05-03 MED ORDER — FUROSEMIDE 40 MG PO TABS: 40.0000 mg | ORAL_TABLET | Freq: Every day | ORAL | Status: DC

## 2015-05-03 MED ORDER — FUROSEMIDE 10 MG/ML IJ SOLN
40.0000 mg | Freq: Once | INTRAMUSCULAR | Status: AC
  Administered 2015-05-03: 40 mg via INTRAVENOUS
  Filled 2015-05-03: qty 4

## 2015-05-03 NOTE — ED Provider Notes (Signed)
CSN: [id number]     Arrival date & time 05/03/15  1950 History  By signing my name below, I, Budd Palmer, attest that this documentation has been prepared under the direction and in the presence of Dione Booze, MD. Electronically Signed: Budd Palmer, ED Scribe. 05/03/2015. 8:36 PM.    Chief Complaint  Patient presents with  . Leg Swelling   The history is provided by the patient and a relative. No language interpreter was used.   HPI Comments: Chris Hicks is a 64 y.o. male former smoker with a PMHx of HTN, chronic knee and back pain, and lumbar radiculopathy who presents to the Emergency Department complaining of worsening, constant, bilateral leg swelling onset 3 days ago. Pt states his legs typically swell up and then "go back down," which has been going on for over 3 months, per relative. He states he takes 1 20 mg Furosemide 1x daily. He reports associated bilateral leg pain (currently rated as a 9/10), dyspnea on exertion (onset "here lately", most recently from walking a distance of 50 ft), SOB (onset last night while sleeping), central chest pressure, nausea, and sinus pressure. He notes exacerbation of the pain with walking and weight bearing. He also reports that since he was put on one of his medications, he has had multiple wounds appear on his arms, which he was told was one of the side effects of this medication. Per relative, pt's PCP is Dr Kendrick Jester. Pt notes a PSHx of back surgery for which he is currently on pain medication. He states he has never seen a heart specialist. Pt denies diaphoresis.   Past Medical History  Diagnosis Date  . Chronic knee pain   . Chronic back pain   . Lumbar radiculopathy   . Hypertension    Past Surgical History  Procedure Laterality Date  . Back surgery    . Splenectomy     No family history on file. Social History  Substance Use Topics  . Smoking status: Former Smoker -- 20 years  . Smokeless tobacco: None  . Alcohol Use:  No    Review of Systems  Constitutional: Negative for diaphoresis.  HENT: Positive for sinus pressure.   Respiratory: Positive for shortness of breath.   Cardiovascular: Positive for chest pain and leg swelling.  Gastrointestinal: Positive for nausea.  Musculoskeletal: Positive for myalgias.  Skin: Positive for wound.  All other systems reviewed and are negative.   Allergies  Review of patient's allergies indicates no known allergies.  Home Medications   Prior to Admission medications   Medication Sig Start Date End Date Taking? Authorizing Provider  cephALEXin (KEFLEX) 500 MG capsule Take 500 mg by mouth 4 (four) times daily.   Yes Historical Provider, MD  furosemide (LASIX) 20 MG tablet Take 1 tablet (20 mg total) by mouth daily. 04/25/13  Yes Benjiman Core, MD  gabapentin (NEURONTIN) 300 MG capsule Take 300 mg by mouth 3 (three) times daily as needed (for pain).   Yes Historical Provider, MD  lisinopril (PRINIVIL,ZESTRIL) 5 MG tablet Take 5 mg by mouth daily.   Yes Historical Provider, MD  HYDROcodone-acetaminophen (NORCO/VICODIN) 5-325 MG per tablet Take one-two tabs po q 4-6 hrs prn pain Patient not taking: Reported on 05/03/2015 07/03/14   Tammy Triplett, PA-C   BP 144/83 mmHg  Pulse 88  Temp(Src) 98 F (36.7 C) (Oral)  [id number] Ht  (1.981 m)  Wt 285 lb (129.275 kg)  BMI 32.94 kg/m2  SpO2 100% Physical  Exam  Constitutional: He is oriented to person, place, and time. He appears well-developed and well-nourished.  HENT:  Head: Normocephalic and atraumatic.  Eyes: Conjunctivae and EOM are normal. Pupils are equal, round, and reactive to light. Right eye exhibits no discharge. Left eye exhibits no discharge.  Neck: Normal range of motion. Neck supple. No JVD present.  Cardiovascular: Normal rate and regular rhythm.   Murmur heard. 1/6 early systolic murmur heard at the L sternal border  Pulmonary/Chest: Effort normal and breath sounds normal. He has no wheezes.  He has no rales. He exhibits no tenderness.  Abdominal: Soft. Bowel sounds are normal. He exhibits no distension and no mass. There is no tenderness.  Musculoskeletal: Normal range of motion. He exhibits edema.  3+ pretibial edema, and 2+ presacral edema, multiple excoriations on L arm with no evidence of active infection  Lymphadenopathy:    He has no cervical adenopathy.  Neurological: He is alert and oriented to person, place, and time. No cranial nerve deficit. He exhibits normal muscle tone. Coordination normal.  Skin: Skin is warm and dry. No rash noted. He is not diaphoretic. No erythema.  Psychiatric: He has a normal mood and affect. His behavior is normal. Judgment and thought content normal.  Nursing note and vitals reviewed.   ED Course  Procedures  DIAGNOSTIC STUDIES: Oxygen Saturation is 100% on RA, normal by my interpretation.    COORDINATION OF CARE: 8:32 PM - Discussed plans to order diagnostic studies and imaging. Will order a dose of furosemide. Pt advised of plan for treatment and pt agrees.  Labs Review Results for orders placed or performed during the hospital encounter of 05/03/15  Comprehensive metabolic panel  Result Value Ref Range   Sodium 137 135 - 145 mmol/L   Potassium 4.0 3.5 - 5.1 mmol/L   Chloride 109 101 - 111 mmol/L   CO2 20 (L) 22 - 32 mmol/L   Glucose, Bld 112 (H) 65 - 99 mg/dL   BUN 14 6 - 20 mg/dL   Creatinine, Ser 4.090.74 0.61 - 1.24 mg/dL   Calcium 8.7 (L) 8.9 - 10.3 mg/dL   Total Protein 7.5 6.5 - 8.1 g/dL   Albumin 3.1 (L) 3.5 - 5.0 g/dL   AST 73 (H) 15 - 41 U/L   ALT 47 17 - 63 U/L   Alkaline Phosphatase 103 38 - 126 U/L   Total Bilirubin 0.5 0.3 - 1.2 mg/dL   GFR calc non Af Amer >60 >60 mL/min   GFR calc Af Amer >60 >60 mL/min   Anion gap 8 5 - 15  Troponin I  Result Value Ref Range   Troponin I <0.03 <0.031 ng/mL  CBC with Differential  Result Value Ref Range   WBC 5.0 4.0 - 10.5 K/uL   RBC 3.57 (L) 4.22 - 5.81 MIL/uL    Hemoglobin 12.1 (L) 13.0 - 17.0 g/dL   HCT 81.136.1 (L) 91.439.0 - 78.252.0 %   MCV 101.1 (H) 78.0 - 100.0 fL   MCH 33.9 26.0 - 34.0 pg   MCHC 33.5 30.0 - 36.0 g/dL   RDW 95.615.4 21.311.5 - 08.615.5 %   Platelets 191 150 - 400 K/uL   Neutrophils Relative % 34 %   Neutro Abs 1.8 1.7 - 7.7 K/uL   Lymphocytes Relative 33 %   Lymphs Abs 1.7 0.7 - 4.0 K/uL   Monocytes Relative 25 %   Monocytes Absolute 1.2 (H) 0.1 - 1.0 K/uL   Eosinophils Relative 6 %  Eosinophils Absolute 0.3 0.0 - 0.7 K/uL   Basophils Relative 2 %   Basophils Absolute 0.1 0.0 - 0.1 K/uL  Brain natriuretic peptide  Result Value Ref Range   B Natriuretic Peptide 86.0 0.0 - 100.0 pg/mL  Urinalysis, Routine w reflex microscopic  Result Value Ref Range   Color, Urine YELLOW YELLOW   APPearance CLEAR CLEAR   Specific Gravity, Urine 1.015 1.005 - 1.030   pH 5.0 5.0 - 8.0   Glucose, UA NEGATIVE NEGATIVE mg/dL   Hgb urine dipstick NEGATIVE NEGATIVE   Bilirubin Urine NEGATIVE NEGATIVE   Ketones, ur NEGATIVE NEGATIVE mg/dL   Protein, ur NEGATIVE NEGATIVE mg/dL   Nitrite NEGATIVE NEGATIVE   Leukocytes, UA NEGATIVE NEGATIVE   Imaging Review Dg Chest 2 View  05/03/2015  CLINICAL DATA:  Shortness of breath with lower extremity edema EXAM: CHEST  2 VIEW COMPARISON:  None. FINDINGS: There is generalized interstitial prominence. There is mild atelectasis in left base. There is no frank edema or consolidation. Heart size and pulmonary vascularity are normal. No adenopathy. There is rather marked compression of a lower thoracic vertebral body. IMPRESSION: Mild atelectasis left base. Interstitium is prominent overall, likely representing chronic inflammatory type change. No frank edema or consolidation. Cardiac silhouette within normal limits. Electronically Signed   By: Bretta Bang III M.D.   On: 05/03/2015 21:36   I have personally reviewed and evaluated these images and lab results as part of my medical decision-making.   EKG  Interpretation   Date/Time:  Monday May 03 2015 20:52:46 EST Ventricular Rate:  89 PR Interval:  184 QRS Duration: 105 QT Interval:  373 QTC Calculation: 454 R Axis:   22 Text Interpretation:  Sinus rhythm Probable lateral infarct, old Otherwise  within normal limits No old tracing to compare Confirmed by [facility]  MD,  Mckynleigh Mussell ([id number]) on 05/03/2015 9:10:27 PM      MDM   Final diagnoses:  Peripheral edema  Macrocytic anemia    Peripheral edema and exertional dyspnea worrisome for congestive heart failure. He has failed to respond to low-dose furosemide. He has no relevant past visits. Screening labs and chest x-ray are ordered and is given a dose of furosemide intravenously. He had reasonably good diuresis with this. Laboratory workup was unrevealing including normal BNP. However, with predominantly right-sided failure, BNP can be normal. Chest x-ray also showed no cardiomegaly but that can be seen with predominantly right-sided failure. He is noted to have a mild edema with slight macrocytosis. Renal function is normal. He is discharged with a prescription for furosemide 40 mg to replace the furosemide 20 mg she had been taking and he is referred to cardiology for consideration for outpatient echocardiogram to look for signs of heart failure including diastolic dysfunction.  I personally performed the services described in this documentation, which was scribed in my presence. The recorded information has been reviewed and is accurate.      Dione Booze, MD 05/03/15 [phone]

## 2015-05-03 NOTE — Discharge Instructions (Signed)
Make an appointment with the heart specialist to be evaluated for possible heart failure. Weigh your self every day - weight changes are due to fluid gain or loss. You will need to work with your doctor to find your "dry weight". Take the 40 mg furosemide in place of the 20 mg furosemide.  Peripheral Edema You have swelling in your legs (peripheral edema). This swelling is due to excess accumulation of salt and water in your body. Edema may be a sign of heart, kidney or liver disease, or a side effect of a medication. It may also be due to problems in the leg veins. Elevating your legs and using special support stockings may be very helpful, if the cause of the swelling is due to poor venous circulation. Avoid long periods of standing, whatever the cause. Treatment of edema depends on identifying the cause. Chips, pretzels, pickles and other salty foods should be avoided. Restricting salt in your diet is almost always needed. Water pills (diuretics) are often used to remove the excess salt and water from your body via urine. These medicines prevent the kidney from reabsorbing sodium. This increases urine flow. Diuretic treatment may also result in lowering of potassium levels in your body. Potassium supplements may be needed if you have to use diuretics daily. Daily weights can help you keep track of your progress in clearing your edema. You should call your caregiver for follow up care as recommended. SEEK IMMEDIATE MEDICAL CARE IF:   You have increased swelling, pain, redness, or heat in your legs.  You develop shortness of breath, especially when lying down.  You develop chest or abdominal pain, weakness, or fainting.  You have a fever.   This information is not intended to replace advice given to you by your health care provider. Make sure you discuss any questions you have with your health care provider.   Document Released: 06/08/2004 Document Revised: 07/24/2011 Document Reviewed:  11/11/2014 Elsevier Interactive Patient Education [employer]

## 2015-05-03 NOTE — ED Notes (Signed)
Pt c/o bilateral leg swelling that became worse two days ago, sob with exertion, c/o pain to bilateral legs,

## 2016-07-05 ENCOUNTER — Emergency Department (HOSPITAL_COMMUNITY): Payer: Self-pay

## 2016-07-05 ENCOUNTER — Encounter (HOSPITAL_COMMUNITY): Payer: Self-pay | Admitting: Cardiology

## 2016-07-05 ENCOUNTER — Emergency Department (HOSPITAL_COMMUNITY)
Admission: EM | Admit: 2016-07-05 | Discharge: 2016-07-05 | Disposition: A | Discharge status: Home / Self Care | Payer: Self-pay | Attending: Emergency Medicine | Admitting: Emergency Medicine

## 2016-07-05 DIAGNOSIS — Y939 Activity, unspecified: Secondary | ICD-10-CM | POA: Insufficient documentation

## 2016-07-05 DIAGNOSIS — Z87891 Personal history of nicotine dependence: Secondary | ICD-10-CM | POA: Insufficient documentation

## 2016-07-05 DIAGNOSIS — Z79899 Other long term (current) drug therapy: Secondary | ICD-10-CM | POA: Insufficient documentation

## 2016-07-05 DIAGNOSIS — R609 Edema, unspecified: Secondary | ICD-10-CM

## 2016-07-05 DIAGNOSIS — Y999 Unspecified external cause status: Secondary | ICD-10-CM | POA: Insufficient documentation

## 2016-07-05 DIAGNOSIS — Y929 Unspecified place or not applicable: Secondary | ICD-10-CM | POA: Insufficient documentation

## 2016-07-05 DIAGNOSIS — R600 Localized edema: Secondary | ICD-10-CM | POA: Insufficient documentation

## 2016-07-05 DIAGNOSIS — R0602 Shortness of breath: Secondary | ICD-10-CM | POA: Insufficient documentation

## 2016-07-05 DIAGNOSIS — S2241XA Multiple fractures of ribs, right side, initial encounter for closed fracture: Secondary | ICD-10-CM | POA: Insufficient documentation

## 2016-07-05 DIAGNOSIS — W010XXA Fall on same level from slipping, tripping and stumbling without subsequent striking against object, initial encounter: Secondary | ICD-10-CM | POA: Insufficient documentation

## 2016-07-05 DIAGNOSIS — I10 Essential (primary) hypertension: Secondary | ICD-10-CM | POA: Insufficient documentation

## 2016-07-05 LAB — CBC WITH DIFFERENTIAL/PLATELET
BASOS ABS: 0.1 10*3/uL (ref 0.0–0.1)
Basophils Relative: 1 %
EOS PCT: 1 %
Eosinophils Absolute: 0.1 10*3/uL (ref 0.0–0.7)
HEMATOCRIT: 34.9 % — AB (ref 39.0–52.0)
HEMOGLOBIN: 12.1 g/dL — AB (ref 13.0–17.0)
LYMPHS ABS: 1.7 10*3/uL (ref 0.7–4.0)
Lymphocytes Relative: 25 %
MCH: 37.2 pg — ABNORMAL HIGH (ref 26.0–34.0)
MCHC: 34.7 g/dL (ref 30.0–36.0)
MCV: 107.4 fL — AB (ref 78.0–100.0)
Monocytes Absolute: 1.1 10*3/uL — ABNORMAL HIGH (ref 0.1–1.0)
Monocytes Relative: 16 %
NEUTROS ABS: 3.7 10*3/uL (ref 1.7–7.7)
Neutrophils Relative %: 57 %
PLATELETS: 209 10*3/uL (ref 150–400)
RBC: 3.25 MIL/uL — AB (ref 4.22–5.81)
RDW: 15.7 % — ABNORMAL HIGH (ref 11.5–15.5)
WBC: 6.6 10*3/uL (ref 4.0–10.5)

## 2016-07-05 LAB — TROPONIN I: Troponin I: <0.03 ng/mL (ref <0.03)

## 2016-07-05 LAB — COMPREHENSIVE METABOLIC PANEL
ALK PHOS: 126 U/L (ref 38–126)
ALT: 34 U/L (ref 17–63)
AST: 83 U/L — AB (ref 15–41)
Albumin: 2.8 g/dL — ABNORMAL LOW (ref 3.5–5.0)
Anion gap: 9 (ref 5–15)
BILIRUBIN TOTAL: 1.1 mg/dL (ref 0.3–1.2)
BUN: 12 mg/dL (ref 6–20)
CALCIUM: 8.2 mg/dL — AB (ref 8.9–10.3)
CO2: 24 mmol/L (ref 22–32)
Chloride: 103 mmol/L (ref 101–111)
Creatinine, Ser: 0.74 mg/dL (ref 0.61–1.24)
GFR calc Af Amer: >60 mL/min (ref >60)
GFR calc non Af Amer: >60 mL/min (ref >60)
Glucose, Bld: 100 mg/dL — ABNORMAL HIGH (ref 65–99)
Potassium: 3.8 mmol/L (ref 3.5–5.1)
Sodium: 136 mmol/L (ref 135–145)
TOTAL PROTEIN: 7.9 g/dL (ref 6.5–8.1)

## 2016-07-05 LAB — BRAIN NATRIURETIC PEPTIDE: B Natriuretic Peptide: 199 pg/mL — ABNORMAL HIGH (ref 0.0–100.0)

## 2016-07-05 MED ORDER — FUROSEMIDE 10 MG/ML IJ SOLN
40.0000 mg | Freq: Once | INTRAMUSCULAR | Status: AC
  Administered 2016-07-05: 40 mg via INTRAVENOUS
  Filled 2016-07-05: qty 4

## 2016-07-05 MED ORDER — HYDROCODONE-ACETAMINOPHEN 5-325 MG PO TABS
1.0000 | ORAL_TABLET | Freq: Once | ORAL | Status: AC
  Administered 2016-07-05: 1 via ORAL
  Filled 2016-07-05: qty 1

## 2016-07-05 MED ORDER — HYDROCODONE-ACETAMINOPHEN 5-325 MG PO TABS: 1.0000 | ORAL_TABLET | Freq: Four times a day (QID) | ORAL | 0 refills | Status: DC | PRN

## 2016-07-05 NOTE — ED Triage Notes (Signed)
Fall 5 days ago.  Tripped over block.  C/o pain to right ribs.  Also lower extremity edema times one week.

## 2016-07-05 NOTE — Discharge Instructions (Signed)
Increase your Lasix to 40 mg twice daily or the next week.  Hydrocodone as prescribed as needed for pain.  You should follow-up with your doctor for a recheck in 1 week  Return to the emergency department if you develop worsening pain, difficulty breathing, or other new and concerning symptoms.

## 2016-07-05 NOTE — ED Notes (Signed)
ED Provider at bedside. 

## 2016-07-05 NOTE — ED Provider Notes (Signed)
[facility]Y DEPT Provider Note   CSN: [id number] Arrival date & time: 07/05/16  1653     History   Chief Complaint Chief Complaint  Patient presents with  . Fall  . Leg Swelling    HPI ETIENNE MOWERS is a 66 y.o. male.  Patient is a 66 year old male with past medical history of hypertension, low back pain. He presents for evaluation of leg swelling that has been ongoing for the past 2 weeks. He is taking his Lasix and denies having missed any doses. He denies any chest pain, but does feel occasionally short of breath with ambulation. He reports tripping over a cinder block 3 days ago and landing on his right chest. He reports pain with breathing, movement, and palpation. He denies any fevers or chills. He denies any productive cough.      Past Medical History:  Diagnosis Date  . Chronic back pain   . Chronic knee pain   . Hypertension   . Lumbar radiculopathy     There are no active problems to display for this patient.   Past Surgical History:  Procedure Laterality Date  . BACK SURGERY    . SPLENECTOMY         Home Medications    Prior to Admission medications   Medication Sig Start Date End Date Taking? Authorizing Provider  cephALEXin (KEFLEX) 500 MG capsule Take 500 mg by mouth 4 (four) times daily.    Historical Provider, MD  furosemide (LASIX) 40 MG tablet Take 1 tablet (40 mg total) by mouth daily. 05/03/15   Dione Booze, MD  gabapentin (NEURONTIN) 300 MG capsule Take 300 mg by mouth 3 (three) times daily as needed (for pain).    Historical Provider, MD  lisinopril (PRINIVIL,ZESTRIL) 5 MG tablet Take 5 mg by mouth daily.    Historical Provider, MD    Family History History reviewed. No pertinent family history.  Social History Social History  Substance Use Topics  . Smoking status: Former Smoker    Years: 20.00  . Smokeless tobacco: Not on file  . Alcohol use Yes     Comment: occasional      Allergies   Patient has no known  allergies.   Review of Systems Review of Systems  All other systems reviewed and are negative.    Physical Exam Updated Vital Signs BP 136/79 (BP Location: Left Arm)   Pulse 102   Temp 99.4 F (37.4 C) (Oral)   Resp 18   Ht 6\' 6"  (1.981 m)   Wt 280 lb (127 kg)   SpO2 97%   BMI 32.36 kg/m   Physical Exam  Constitutional: He is oriented to person, place, and time. He appears well-developed and well-nourished. No distress.  HENT:  Head: Normocephalic and atraumatic.  Mouth/Throat: Oropharynx is clear and moist.  Neck: Normal range of motion. Neck supple.  Cardiovascular: Normal rate and regular rhythm.  Exam reveals no friction rub.   No murmur heard. Pulmonary/Chest: Effort normal and breath sounds normal. No respiratory distress. He has no wheezes. He has no rales.  Abdominal: Soft. Bowel sounds are normal. He exhibits no distension. There is no tenderness.  Musculoskeletal: Normal range of motion. He exhibits edema.  There is 3+ pitting edema of both lower extremities.  Neurological: He is alert and oriented to person, place, and time. Coordination normal.  Skin: Skin is warm and dry. He is not diaphoretic.  Nursing note and vitals reviewed.    ED Treatments / Results  Labs (all labs ordered are listed, but only abnormal results are displayed) Labs Reviewed  CBC WITH DIFFERENTIAL/PLATELET  COMPREHENSIVE METABOLIC PANEL  TROPONIN I  BRAIN NATRIURETIC PEPTIDE    EKG  EKG Interpretation None       Radiology No results found.  Procedures Procedures (including critical care time)  Medications Ordered in ED Medications  furosemide (LASIX) injection 40 mg (not administered)     Initial Impression / Assessment and Plan / ED Course  I have reviewed the triage vital signs and the nursing notes.  Pertinent labs & imaging results that were available during my care of the patient were reviewed by me and considered in my medical decision making (see chart for  details).  Patient presents here with the separate complaints of leg swelling and pain in his ribs after a fall. He has significant edema, however does not appear to be in overt heart failure. His chest x-ray, BNP, and troponin or not consistent with a cardiac event. He was given an additional dose of Lasix here in the ER and will be discharged with an increased dose of Lasix.  He does appear to have 2 right-sided rib fractures with no evidence for pneumothorax. He will be given pain medication and advised to follow-up as needed if symptoms worsen.  Final Clinical Impressions(s) / ED Diagnoses   Final diagnoses:  None    New Prescriptions New Prescriptions   No medications on file     Geoffery Lyonsouglas Astoria Condon, MD 07/05/16 2107

## 2017-02-19 ENCOUNTER — Encounter (HOSPITAL_COMMUNITY): Payer: Self-pay | Admitting: *Deleted

## 2017-02-19 ENCOUNTER — Inpatient Hospital Stay (HOSPITAL_COMMUNITY)
Admission: EM | Admit: 2017-02-19 | Discharge: 2017-02-24 | DRG: 194 | Disposition: A | Discharge status: Home / Self Care | Payer: Self-pay | Attending: Internal Medicine | Admitting: Internal Medicine

## 2017-02-19 ENCOUNTER — Emergency Department (HOSPITAL_COMMUNITY): Payer: Self-pay

## 2017-02-19 DIAGNOSIS — Z6827 Body mass index (BMI) 27.0-27.9, adult: Secondary | ICD-10-CM

## 2017-02-19 DIAGNOSIS — I951 Orthostatic hypotension: Secondary | ICD-10-CM | POA: Diagnosis present

## 2017-02-19 DIAGNOSIS — Z9081 Acquired absence of spleen: Secondary | ICD-10-CM

## 2017-02-19 DIAGNOSIS — Y900 Blood alcohol level of less than 20 mg/100 ml: Secondary | ICD-10-CM | POA: Diagnosis present

## 2017-02-19 DIAGNOSIS — R091 Pleurisy: Secondary | ICD-10-CM

## 2017-02-19 DIAGNOSIS — I491 Atrial premature depolarization: Secondary | ICD-10-CM | POA: Diagnosis present

## 2017-02-19 DIAGNOSIS — K7460 Unspecified cirrhosis of liver: Secondary | ICD-10-CM

## 2017-02-19 DIAGNOSIS — K7030 Alcoholic cirrhosis of liver without ascites: Secondary | ICD-10-CM | POA: Diagnosis present

## 2017-02-19 DIAGNOSIS — F1011 Alcohol abuse, in remission: Secondary | ICD-10-CM | POA: Diagnosis present

## 2017-02-19 DIAGNOSIS — J690 Pneumonitis due to inhalation of food and vomit: Secondary | ICD-10-CM | POA: Diagnosis present

## 2017-02-19 DIAGNOSIS — N401 Enlarged prostate with lower urinary tract symptoms: Secondary | ICD-10-CM | POA: Diagnosis present

## 2017-02-19 DIAGNOSIS — J90 Pleural effusion, not elsewhere classified: Secondary | ICD-10-CM | POA: Diagnosis present

## 2017-02-19 DIAGNOSIS — E876 Hypokalemia: Secondary | ICD-10-CM | POA: Diagnosis present

## 2017-02-19 DIAGNOSIS — Z87891 Personal history of nicotine dependence: Secondary | ICD-10-CM

## 2017-02-19 DIAGNOSIS — R3911 Hesitancy of micturition: Secondary | ICD-10-CM | POA: Diagnosis present

## 2017-02-19 DIAGNOSIS — K7031 Alcoholic cirrhosis of liver with ascites: Secondary | ICD-10-CM | POA: Diagnosis present

## 2017-02-19 DIAGNOSIS — G8929 Other chronic pain: Secondary | ICD-10-CM | POA: Diagnosis present

## 2017-02-19 DIAGNOSIS — R000 Tachycardia, unspecified: Secondary | ICD-10-CM | POA: Diagnosis present

## 2017-02-19 DIAGNOSIS — Z23 Encounter for immunization: Secondary | ICD-10-CM

## 2017-02-19 DIAGNOSIS — E440 Moderate protein-calorie malnutrition: Secondary | ICD-10-CM | POA: Diagnosis present

## 2017-02-19 DIAGNOSIS — I10 Essential (primary) hypertension: Secondary | ICD-10-CM | POA: Diagnosis present

## 2017-02-19 DIAGNOSIS — Z79899 Other long term (current) drug therapy: Secondary | ICD-10-CM

## 2017-02-19 DIAGNOSIS — D638 Anemia in other chronic diseases classified elsewhere: Secondary | ICD-10-CM | POA: Diagnosis present

## 2017-02-19 DIAGNOSIS — R1311 Dysphagia, oral phase: Secondary | ICD-10-CM | POA: Diagnosis present

## 2017-02-19 DIAGNOSIS — E871 Hypo-osmolality and hyponatremia: Secondary | ICD-10-CM | POA: Diagnosis present

## 2017-02-19 DIAGNOSIS — R34 Anuria and oliguria: Secondary | ICD-10-CM | POA: Diagnosis present

## 2017-02-19 DIAGNOSIS — J181 Lobar pneumonia, unspecified organism: Principal | ICD-10-CM | POA: Diagnosis present

## 2017-02-19 DIAGNOSIS — M25569 Pain in unspecified knee: Secondary | ICD-10-CM | POA: Diagnosis present

## 2017-02-19 DIAGNOSIS — J189 Pneumonia, unspecified organism: Secondary | ICD-10-CM

## 2017-02-19 LAB — HEPATIC FUNCTION PANEL
ALBUMIN: 2.1 g/dL — AB (ref 3.5–5.0)
ALK PHOS: 96 U/L (ref 38–126)
ALT: 9 U/L — ABNORMAL LOW (ref 17–63)
AST: 26 U/L (ref 15–41)
BILIRUBIN TOTAL: 4.2 mg/dL — AB (ref 0.3–1.2)
Bilirubin, Direct: 1.2 mg/dL — ABNORMAL HIGH (ref 0.1–0.5)
Indirect Bilirubin: 3 mg/dL — ABNORMAL HIGH (ref 0.3–0.9)
Total Protein: 6.8 g/dL (ref 6.5–8.1)

## 2017-02-19 LAB — BASIC METABOLIC PANEL
ANION GAP: 9 (ref 5–15)
BUN: 13 mg/dL (ref 6–20)
CALCIUM: 7.9 mg/dL — AB (ref 8.9–10.3)
CO2: 24 mmol/L (ref 22–32)
Chloride: 99 mmol/L — ABNORMAL LOW (ref 101–111)
Creatinine, Ser: 0.89 mg/dL (ref 0.61–1.24)
GFR calc Af Amer: >60 mL/min (ref >60)
GFR calc non Af Amer: >60 mL/min (ref >60)
GLUCOSE: 108 mg/dL — AB (ref 65–99)
Potassium: 2.6 mmol/L — CL (ref 3.5–5.1)
Sodium: 132 mmol/L — ABNORMAL LOW (ref 135–145)

## 2017-02-19 LAB — CBC
HEMATOCRIT: 32.6 % — AB (ref 39.0–52.0)
HEMOGLOBIN: 11.2 g/dL — AB (ref 13.0–17.0)
MCH: 34.6 pg — AB (ref 26.0–34.0)
MCHC: 34.4 g/dL (ref 30.0–36.0)
MCV: 100.6 fL — ABNORMAL HIGH (ref 78.0–100.0)
Platelets: 153 10*3/uL (ref 150–400)
RBC: 3.24 MIL/uL — ABNORMAL LOW (ref 4.22–5.81)
RDW: 15.9 % — ABNORMAL HIGH (ref 11.5–15.5)
WBC: 5.3 10*3/uL (ref 4.0–10.5)

## 2017-02-19 LAB — PROTIME-INR
INR: 1.52
Prothrombin Time: 18.1 seconds — ABNORMAL HIGH (ref 11.4–15.2)

## 2017-02-19 LAB — ETHANOL: Alcohol, Ethyl (B): <10 mg/dL (ref <10)

## 2017-02-19 LAB — I-STAT TROPONIN, ED: Troponin i, poc: 0.03 ng/mL (ref 0.00–0.08)

## 2017-02-19 LAB — AMMONIA: Ammonia: 22 umol/L (ref 9–35)

## 2017-02-19 LAB — MAGNESIUM: Magnesium: 1.6 mg/dL — ABNORMAL LOW (ref 1.7–2.4)

## 2017-02-19 MED ORDER — POTASSIUM CHLORIDE 10 MEQ/100ML IV SOLN
10.0000 meq | INTRAVENOUS | Status: AC
  Administered 2017-02-19: 10 meq via INTRAVENOUS
  Filled 2017-02-19: qty 100

## 2017-02-19 MED ORDER — SODIUM CHLORIDE 0.9 % IV SOLN
INTRAVENOUS | Status: DC
  Administered 2017-02-20: 20:00:00 via INTRAVENOUS

## 2017-02-19 MED ORDER — FENTANYL CITRATE (PF) 100 MCG/2ML IJ SOLN
25.0000 ug | Freq: Once | INTRAMUSCULAR | Status: AC
  Administered 2017-02-19: 25 ug via INTRAVENOUS
  Filled 2017-02-19: qty 2

## 2017-02-19 MED ORDER — POTASSIUM CHLORIDE CRYS ER 20 MEQ PO TBCR
40.0000 meq | EXTENDED_RELEASE_TABLET | Freq: Once | ORAL | Status: AC
  Administered 2017-02-19: 40 meq via ORAL
  Filled 2017-02-19: qty 2

## 2017-02-19 MED ORDER — LEVOFLOXACIN IN D5W 750 MG/150ML IV SOLN
750.0000 mg | INTRAVENOUS | Status: DC
  Administered 2017-02-20: 750 mg via INTRAVENOUS
  Filled 2017-02-19: qty 150

## 2017-02-19 MED ORDER — PNEUMOCOCCAL VAC POLYVALENT 25 MCG/0.5ML IJ INJ
0.5000 mL | INJECTION | INTRAMUSCULAR | Status: AC
  Administered 2017-02-20: 0.5 mL via INTRAMUSCULAR
  Filled 2017-02-19: qty 0.5

## 2017-02-19 MED ORDER — LEVOFLOXACIN IN D5W 750 MG/150ML IV SOLN
750.0000 mg | Freq: Once | INTRAVENOUS | Status: AC
  Administered 2017-02-19: 750 mg via INTRAVENOUS
  Filled 2017-02-19: qty 150

## 2017-02-19 MED ORDER — INFLUENZA VAC SPLIT QUAD 0.5 ML IM SUSY
0.5000 mL | PREFILLED_SYRINGE | INTRAMUSCULAR | Status: AC
  Administered 2017-02-20: 0.5 mL via INTRAMUSCULAR
  Filled 2017-02-19: qty 0.5

## 2017-02-19 MED ORDER — POTASSIUM CHLORIDE 10 MEQ/100ML IV SOLN
10.0000 meq | Freq: Once | INTRAVENOUS | Status: AC
  Administered 2017-02-19: 10 meq via INTRAVENOUS
  Filled 2017-02-19: qty 100

## 2017-02-19 MED ORDER — MIDODRINE HCL 5 MG PO TABS
5.0000 mg | ORAL_TABLET | Freq: Two times a day (BID) | ORAL | Status: DC
  Administered 2017-02-19 – 2017-02-24 (×10): 5 mg via ORAL
  Filled 2017-02-19 (×10): qty 1

## 2017-02-19 MED ORDER — VITAMIN B-1 100 MG PO TABS
100.0000 mg | ORAL_TABLET | Freq: Every day | ORAL | Status: DC
  Administered 2017-02-19 – 2017-02-24 (×6): 100 mg via ORAL
  Filled 2017-02-19 (×6): qty 1

## 2017-02-19 MED ORDER — FENTANYL CITRATE (PF) 100 MCG/2ML IJ SOLN
50.0000 ug | Freq: Once | INTRAMUSCULAR | Status: AC
Start: 2017-02-19 — End: 2017-02-19
  Administered 2017-02-19: 50 ug via INTRAVENOUS
  Filled 2017-02-19: qty 2

## 2017-02-19 MED ORDER — ENOXAPARIN SODIUM 40 MG/0.4ML ~~LOC~~ SOLN
40.0000 mg | SUBCUTANEOUS | Status: DC
Start: 2017-02-19 — End: 2017-02-24
  Administered 2017-02-19 – 2017-02-23 (×5): 40 mg via SUBCUTANEOUS
  Filled 2017-02-19 (×5): qty 0.4

## 2017-02-19 NOTE — H&P (Signed)
[facility] H&P    Patient Demographics:    Chris Hicks, is a 66 y.o. male  MRN: [id number]  DOB - November 03, 1950  Admit Date - 02/19/2017  Referring MD/NP/PA: Dr. Bebe Shaggy  Outpatient Primary MD for the patient is Mickeal Jester, DO  Patient coming from: Home  Chief Complaint  Patient presents with  . Chest Pain      HPI:    Chris Hicks  is a 66 y.o. male, With history of liver cirrhosis, hypertension, long-standing history of alcohol abuse, quit 6 months ago came to hospital with complaints of left-sided chest pain, cough with yellow phlegm. Patient describes pain as sharp. Symptoms worse on deep breathing. He also complains of shortness of breath.  He denies nausea vomiting or diarrhea. He complains of dysuria No history of passing out, no blurred vision. No previous history of stroke or seizures.  In the ED, chest x-ray showed right pleural effusion with right basilar airspace opacity which may reflect asymmetric interstitial edema or pneumonia. Patient empirically started on Levaquin for community-acquired pneumonia.   Review of systems:    In addition to the HPI above,    All other systems reviewed and are negative.   With Past History of the following :    Past Medical History:  Diagnosis Date  . Chronic back pain   . Chronic knee pain   . Hypertension   . Lumbar radiculopathy       Past Surgical History:  Procedure Laterality Date  . BACK SURGERY    . SPLENECTOMY        Social History:      Social History  Substance Use Topics  . Smoking status: Former Smoker    Years: 20.00  . Smokeless tobacco: Never Used  . Alcohol use Yes     Comment: occasional        Family History :   No family history of heart disease or cancer   Home Medications:   Prior to Admission medications   Medication Sig Start Date End Date Taking? Authorizing Provider  midodrine (PROAMATINE)  5 MG tablet Take 5 mg by mouth 2 (two) times daily with a meal.   Yes [provider]  thiamine (VITAMIN B-1) 100 MG tablet Take 100 mg by mouth daily.   Yes [provider]     Allergies:    No Known Allergies   Physical Exam:   Vitals  Blood pressure (!) 129/101, pulse 98, temperature 98 F (36.7 C), temperature source Oral, resp. rate (!) 28, height  (1.981 m), weight 113.4 kg (250 lb), SpO2 100 %.  1.  General: Appears in no acute distress  2. Psychiatric:  Intact judgement and  insight, awake alert, oriented x 3.  3. Neurologic: No focal neurological deficits, all cranial nerves intact.Strength 5/5 all 4 extremities, sensation intact all 4 extremities, plantars down going.  4. Eyes :  anicteric sclerae, moist conjunctivae with no lid lag. PERRLA.  5. ENMT:  Oropharynx clear with moist mucous membranes and good dentition  6. Neck:  supple, no cervical lymphadenopathy appriciated, No thyromegaly  7. Respiratory : Normal respiratory effort, good air movement bilaterally,clear to  auscultation bilaterally  8. Cardiovascular : RRR, no gallops, rubs or murmurs, no leg edema  9. Gastrointestinal:  Positive bowel sounds, abdomen soft, non-tender to palpation,no hepatosplenomegaly, no rigidity or guarding       10. Skin:  No cyanosis, normal texture and turgor, no rash, lesions or ulcers  11.Musculoskeletal:  Good muscle tone,  joints appear normal , no effusions,  normal range of motion    Data Review:    CBC  Recent Labs Lab 02/19/17 0614  WBC 5.3  HGB 11.2*  HCT 32.6*  PLT 153  MCV 100.6*  MCH 34.6*  MCHC 34.4  RDW 15.9*   ------------------------------------------------------------------------------------------------------------------  Chemistries   Recent Labs Lab 02/19/17 0614  NA 132*  K 2.6*  CL 99*  CO2 24  GLUCOSE 108*  BUN 13  CREATININE 0.89  CALCIUM 7.9*  AST 26  ALT 9*  ALKPHOS 96  BILITOT 4.2*    ------------------------------------------------------------------------------------------------------------------  ------------------------------------------------------------------------------------------------------------------ GFR: Estimated Creatinine Clearance: 118.8 mL/min (by C-G formula based on SCr of 0.89 mg/dL). Liver Function Tests:  Recent Labs Lab 02/19/17 0614  AST 26  ALT 9*  ALKPHOS 96  BILITOT 4.2*  PROT 6.8  ALBUMIN 2.1*   No results for input(s): LIPASE, AMYLASE in the last 168 hours.  Recent Labs Lab 02/19/17 0614  AMMONIA 22   Coagulation Profile:  Recent Labs Lab 02/19/17 0614  INR 1.52    --------------------------------------------------------------------------------------------------------------- Urine analysis:    Component Value Date/Time   COLORURINE YELLOW 05/03/2015 2144   APPEARANCEUR CLEAR 05/03/2015 2144   LABSPEC 1.015 05/03/2015 2144   PHURINE 5.0 05/03/2015 2144   GLUCOSEU NEGATIVE 05/03/2015 2144   HGBUR NEGATIVE 05/03/2015 2144   BILIRUBINUR NEGATIVE 05/03/2015 2144   KETONESUR NEGATIVE 05/03/2015 2144   PROTEINUR NEGATIVE 05/03/2015 2144   UROBILINOGEN 0.2 04/25/2013 1500   NITRITE NEGATIVE 05/03/2015 2144   LEUKOCYTESUR NEGATIVE 05/03/2015 2144      Imaging Results:    Dg Chest Port 1 View  Result Date: 02/19/2017 CLINICAL DATA:  Acute onset of left-sided chest pain and shortness of breath. Initial encounter. EXAM: PORTABLE CHEST 1 VIEW COMPARISON:  Chest radiograph performed 07/05/2016, and CTA of the chest performed 11/16/2016 FINDINGS: A small right pleural effusion is noted, with right basilar airspace opacity. This may reflect asymmetric interstitial edema or pneumonia. The right lung is mildly hypoexpanded. No pneumothorax is seen. The cardiomediastinal silhouette is normal in size. No acute osseous abnormalities are identified. IMPRESSION: Small right pleural effusion, with right basilar airspace opacity. This  may reflect asymmetric interstitial edema or pneumonia. Electronically Signed   By: Roanna Raider M.D.   On: 02/19/2017 06:32    My personal review of EKG: Rhythm NSR, prolonged QT interval 548   Assessment & Plan:    Active Problems:   CAP (community acquired pneumonia)   1. Community-acquired pneumonia- the patient, start Levaquin for pneumonia. Will obtain strep pneumo urinary antigen. Blood cultures 2. Obtain. We'll follow the results. 2. Hypokalemia-patient was found to have potassium of 2.6, replace potassium and check BMP in a.m. 3. QTC prolongation- patient has prolonged QTc 548, will check serum magnesium level. Potassium is being replaced. Will monitor on telemetry. 4. Alcohol liver disease- liver enzymes are within normal limits. Bilirubin slightly elevated 4.2. 5. History of orthostatic hypotension-   he is on midodrine 5 mg by mouth twice a day. We'll continue with  this medication.   DVT Prophylaxis-   Lovenox   AM Labs Ordered, also please review Full Orders  Family Communication: Admission, patients condition and plan of care including tests being ordered have been discussed with the patient and his daughter at bedside who indicate understanding and agree with the plan and Code Status.  Code Status: Full code  Admission status: Inpatient    Time spent in minutes : 60 minutes   Shantana Christon S M.D on 02/19/2017 at 8:02 AM  Between 7am to 7pm - Pager - [phone]. After 7pm go to [email or web address] - password [facility]  [facility] - Office  [phone]

## 2017-02-19 NOTE — ED Notes (Signed)
Does not feel the need to void at this time.

## 2017-02-19 NOTE — ED Triage Notes (Signed)
Pt c/o left side chest pain that started x 1 hour ago;
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
## 2017-02-19 NOTE — ED Provider Notes (Signed)
[facility] DEPT Provider Note   CSN: [id number] Arrival date & time: 02/19/17  0555     History   Chief Complaint Chief Complaint  Patient presents with  . Chest Pain   LEVEL 5 CAVEAT DUE TO ACUITY OF CONDITION  HPI Chris Hicks is a 66 y.o. male.  The history is provided by the patient and a relative.  Chest Pain   This is a new problem. The current episode started 2 days ago. The problem occurs daily. The problem has been gradually worsening. Pain location: left chest. The pain is moderate. The quality of the pain is described as sharp. The symptoms are aggravated by deep breathing. Associated symptoms include shortness of breath.  Patient presents with left sided chest pain for past 2 days He reports it hurts to breathe He reports he feels short of breath He is unable to provide any other details due to severe pain  Past Medical History:  Diagnosis Date  . Chronic back pain   . Chronic knee pain   . Hypertension   . Lumbar radiculopathy     There are no active problems to display for this patient.   Past Surgical History:  Procedure Laterality Date  . BACK SURGERY    . SPLENECTOMY         Home Medications    Prior to Admission medications   Medication Sig Start Date End Date Taking? Authorizing Provider  midodrine (PROAMATINE) 5 MG tablet Take 5 mg by mouth 2 (two) times daily with a meal.   Yes [provider]  thiamine (VITAMIN B-1) 100 MG tablet Take 100 mg by mouth daily.   Yes [provider]    Family History History reviewed. No pertinent family history.  Social History Social History  Substance Use Topics  . Smoking status: Former Smoker    Years: 20.00  . Smokeless tobacco: Never Used  . Alcohol use Yes     Comment: occasional      Allergies   Patient has no known allergies.   Review of Systems Review of Systems  Unable to perform ROS: Acuity of condition  Respiratory: Positive for shortness of breath.    Cardiovascular: Positive for chest pain.     Physical Exam Updated Vital Signs BP (!) 127/101 (BP Location: Left Arm)   Pulse (!) 113   Temp 98.2 F (36.8 C) (Oral)   Resp (!) 22   Ht 1.981 m ( )   Wt 113.4 kg (250 lb)   SpO2 91%   BMI 28.89 kg/m   Physical Exam CONSTITUTIONAL: elderly, ill appearing, appears older than stated age HEAD: Normocephalic/atraumatic EYES: EOMI ENMT: Mucous membranes moist NECK: supple no meningeal signs SPINE/BACK:entire spine nontender CV: S1/S2 noted, no murmurs/rubs/gallops noted LUNGS: tachypnea, equal breath sounds noted, coarse BS noted in each base ABDOMEN: soft, nontende GU:no cva tenderness NEURO: Pt is awake/alert/appropriate, moves all extremitiesx4.  No facial droop.   EXTREMITIES: pulses normal/equal, full ROM, pitting edema noted to bilateral LE  SKIN: warm, color normal PSYCH: anxious, tearful, hyperventilating  ED Treatments / Results  Labs (all labs ordered are listed, but only abnormal results are displayed) Labs Reviewed  BASIC METABOLIC PANEL - Abnormal; Notable for the following:       Result Value   Sodium 132 (*)    Potassium 2.6 (*)    Chloride 99 (*)    Glucose, Bld 108 (*)    Calcium 7.9 (*)    All other components within normal  limits  CBC - Abnormal; Notable for the following:    RBC 3.24 (*)    Hemoglobin 11.2 (*)    HCT 32.6 (*)    MCV 100.6 (*)    MCH 34.6 (*)    RDW 15.9 (*)    All other components within normal limits  HEPATIC FUNCTION PANEL - Abnormal; Notable for the following:    Albumin 2.1 (*)    ALT 9 (*)    Total Bilirubin 4.2 (*)    Bilirubin, Direct 1.2 (*)    Indirect Bilirubin 3.0 (*)    All other components within normal limits  AMMONIA  ETHANOL  PROTIME-INR  I-STAT TROPONIN, ED    EKG  EKG Interpretation  Date/Time:  Monday February 19 2017 06:15:37 EDT Ventricular Rate:  112 PR Interval:    QRS Duration: 100 QT Interval:  420 QTC Calculation: 548 R  Axis:   18 Text Interpretation:  Sinus tachycardia Multiform ventricular premature complexes Borderline repolarization abnormality Prolonged QT interval Interpretation limited secondary to artifact Abnormal ekg Confirmed by Zadie Rhine ([id number]) on 02/19/2017 6:19:13 AM       Radiology Dg Chest Port 1 View  Result Date: 02/19/2017 CLINICAL DATA:  Acute onset of left-sided chest pain and shortness of breath. Initial encounter. EXAM: PORTABLE CHEST 1 VIEW COMPARISON:  Chest radiograph performed 07/05/2016, and CTA of the chest performed 11/16/2016 FINDINGS: A small right pleural effusion is noted, with right basilar airspace opacity. This may reflect asymmetric interstitial edema or pneumonia. The right lung is mildly hypoexpanded. No pneumothorax is seen. The cardiomediastinal silhouette is normal in size. No acute osseous abnormalities are identified. IMPRESSION: Small right pleural effusion, with right basilar airspace opacity. This may reflect asymmetric interstitial edema or pneumonia. Electronically Signed   By: Roanna Raider M.D.   On: 02/19/2017 06:32    Procedures Procedures    Medications Ordered in ED Medications  potassium chloride SA (K-DUR,KLOR-CON) CR tablet 40 mEq (not administered)  levofloxacin (LEVAQUIN) IVPB 750 mg (not administered)  potassium chloride 10 mEq in 100 mL IVPB (not administered)  fentaNYL (SUBLIMAZE) injection 50 mcg (50 mcg Intravenous Given 02/19/17 0620)     Initial Impression / Assessment and Plan / ED Course  I have reviewed the triage vital signs and the nursing notes.  Pertinent labs & imaging results that were available during my care of the patient were reviewed by me and considered in my medical decision making (see chart for details).     6:23 AM Pt presents with acute chest pain for 2 days He is unable to provide significant details due to pain and he also appears to be a poor historian.  When asked if he has other health conditions he  states "just this pain" Daughter at bedside unable to provide any details, reports she got home from work and reports her mother "sits with him" and he was reporting chest pain Review of [facility] from [facility] reveal pt has h/o cirrhosis, h/o ETOH abuse, previous h/o orthostatic hypotension Imaging/labs ordered at this time 6:54 AM Daughter now reports pt recently relocated from [place] city and is getting established locally with PCP He quit drinking ETOH months and no longer smokes She reports he "is forgetful" and gets confused easily  Due to abnormal CXR, co-morbidities, will admit for IV antibiotics/monitoring Pt also noted to be hypokalemic, this will need correction 7:13 AM D/w dr Sharl Ma for admission Given h/o cough, abnormal CXR, suspicious for pneumonia at this time   Final  Clinical Impressions(s) / ED Diagnoses   Final diagnoses:  Community acquired pneumonia of right lower lobe of lung (HCC)  Pleurisy  Hypokalemia    New Prescriptions New Prescriptions   No medications on file     Zadie Rhine, MD 02/19/17 [phone]

## 2017-02-19 NOTE — ED Notes (Signed)
Critical lab value for Potassium of 2.6, notified pt RN LLemons of critical value

## 2017-02-20 DIAGNOSIS — R9431 Abnormal electrocardiogram [ECG] [EKG]: Secondary | ICD-10-CM

## 2017-02-20 LAB — CBC
HCT: 29.5 % — ABNORMAL LOW (ref 39.0–52.0)
HEMOGLOBIN: 10.1 g/dL — AB (ref 13.0–17.0)
MCH: 35.1 pg — AB (ref 26.0–34.0)
MCHC: 34.2 g/dL (ref 30.0–36.0)
MCV: 102.4 fL — ABNORMAL HIGH (ref 78.0–100.0)
Platelets: 149 10*3/uL — ABNORMAL LOW (ref 150–400)
RBC: 2.88 MIL/uL — AB (ref 4.22–5.81)
RDW: 16.7 % — ABNORMAL HIGH (ref 11.5–15.5)
WBC: 6.2 10*3/uL (ref 4.0–10.5)

## 2017-02-20 LAB — URINALYSIS, MICROSCOPIC (REFLEX): BACTERIA UA: NONE SEEN

## 2017-02-20 LAB — URINALYSIS, ROUTINE W REFLEX MICROSCOPIC: PH: 6.5 (ref 5.0–8.0)

## 2017-02-20 LAB — COMPREHENSIVE METABOLIC PANEL
ALT: 6 U/L — ABNORMAL LOW (ref 17–63)
AST: 20 U/L (ref 15–41)
Albumin: 1.8 g/dL — ABNORMAL LOW (ref 3.5–5.0)
Alkaline Phosphatase: 84 U/L (ref 38–126)
Anion gap: 8 (ref 5–15)
BUN: 14 mg/dL (ref 6–20)
CHLORIDE: 100 mmol/L — AB (ref 101–111)
CO2: 24 mmol/L (ref 22–32)
Calcium: 7.9 mg/dL — ABNORMAL LOW (ref 8.9–10.3)
Creatinine, Ser: 0.81 mg/dL (ref 0.61–1.24)
GFR calc Af Amer: >60 mL/min (ref >60)
GFR calc non Af Amer: >60 mL/min (ref >60)
GLUCOSE: 84 mg/dL (ref 65–99)
POTASSIUM: 3.2 mmol/L — AB (ref 3.5–5.1)
SODIUM: 132 mmol/L — AB (ref 135–145)
Total Bilirubin: 3.2 mg/dL — ABNORMAL HIGH (ref 0.3–1.2)
Total Protein: 6 g/dL — ABNORMAL LOW (ref 6.5–8.1)

## 2017-02-20 LAB — HIV ANTIBODY (ROUTINE TESTING W REFLEX): HIV Screen 4th Generation wRfx: NONREACTIVE

## 2017-02-20 MED ORDER — POTASSIUM CHLORIDE CRYS ER 20 MEQ PO TBCR
40.0000 meq | EXTENDED_RELEASE_TABLET | ORAL | Status: AC
  Administered 2017-02-20 (×2): 40 meq via ORAL
  Filled 2017-02-20 (×2): qty 2

## 2017-02-20 MED ORDER — MAGNESIUM SULFATE 2 GM/50ML IV SOLN
2.0000 g | Freq: Once | INTRAVENOUS | Status: AC
  Administered 2017-02-20: 2 g via INTRAVENOUS
  Filled 2017-02-20: qty 50

## 2017-02-20 NOTE — Progress Notes (Signed)
New Onset AFIB w/PVC's. Denies Chest Pain or Shortness of Breath.  Dr. Sharl Ma notified. EKG Stat ordered.

## 2017-02-20 NOTE — Progress Notes (Addendum)
[facility]  PROGRESS NOTE  Chris Hicks ZOX:[id number] DOB: 1951-04-13 DOA: 02/19/2017 PCP: Yader Jester, DO   Brief HPI:    66 y.o. male, With history of liver cirrhosis, hypertension, long-standing history of alcohol abuse, quit 6 months ago came to hospital with complaints of left-sided chest pain, cough with yellow phlegm. Patient describes pain as sharp. Symptoms worse on deep breathing. He also complains of shortness of breath the ED, chest x-ray showed right pleural effusion with right basilar airspace opacity which may reflect asymmetric interstitial edema or pneumonia. Patient empirically started on Levaquin for community-acquired pneumonia.   Subjective   Patient seen and examined, denies shortness of breath. This morning he went into abnormal rhythm on telemetry. EKG was obtained which showed sinus tachycardia with PACs   Assessment/Plan:     1. Community acquired pneumonia- clinically improved, chest x-ray showed right basilar airspace opacity, patient started on Levaquin. Blood cultures 2 are negative to date. Strep pneumo urinary antigen obtained, results are pending. 2. Hypokalemia-patient's potassium was 2.6 on admission, after replacing potassium today is 3.2. Magnesium was checked yesterday which was 1.6. Replace magnesium and potassium. Follow BMP in a.m. 3. Hypomagnesemia-magnesium level is 1.6, will replace magnesium with 2 g mag sulfate IV 1. Follow BMP in a.m. 4. Prolonged QTC-resolved, QTC was prolonged to 548 yesterday. This morning EKG shows QTC 454. Magnesium and potassium is currently being replaced. 5. Sinus tachycardia with PACs-EKG this morning showed sinus tachycardia with PACs. No atrial fibrillation ,called and confirmed with cardiology Dr. Charlton Haws. 6. Alcoholic liver disease-stable, no acute decompensation LFTs are normal. Bili 4.2 7. History of orthostatic hypotension-continue midodrine 5 mg by mouth twice a day. Blood pressure is  stable.    DVT prophylaxis: Lovenox  Code Status: Full code  Family Communication: Discussed with patient's daughter at bedside   Disposition Plan: Likely home in 1-2 days   Consultants:  None  Procedures:  None  Continuous infusions . sodium chloride    . levofloxacin (LEVAQUIN) IV 750 mg (02/20/17 1001)  . magnesium sulfate 1 - 4 g bolus IVPB        Antibiotics:   Anti-infectives    Start     Dose/Rate Route Frequency Ordered Stop   02/20/17 0800  levofloxacin (LEVAQUIN) IVPB 750 mg     750 mg 100 mL/hr over 90 Minutes Intravenous Every 24 hours 02/19/17 1145 02/25/17 0759   02/19/17 0645  levofloxacin (LEVAQUIN) IVPB 750 mg     750 mg 100 mL/hr over 90 Minutes Intravenous  Once 02/19/17 0643 02/19/17 0857       Objective   Vitals:   02/19/17 1538 02/19/17 2009 02/19/17 2147 02/20/17 0546  BP: 138/87  132/77 128/67  Pulse: 86  84 80  Resp: Temp: 98.5 F (36.9 C)  98.2 F (36.8 C) 98.5 F (36.9 C)  TempSrc: Oral  Oral Oral  SpO2: 98% 97% 98% 97%  Weight:    108.1 kg (238 lb 5.1 oz)  Height:        Intake/Output Summary (Last 24 hours) at 02/20/17 1119 Last data filed at 02/20/17 0900  Gross per 24 hour  Intake              720 ml  Output              350 ml  Net              370 ml   [employer]  02/19/17 0600 02/19/17 1043 02/20/17 0546  Weight: 113.4 kg (250 lb) 108.1 kg (238 lb 5.1 oz) 108.1 kg (238 lb 5.1 oz)     Physical Examination:   Physical Exam: Eyes: No icterus, extraocular muscles intact  Mouth: Oral mucosa is moist, no lesions on palate,  Neck: Supple, no deformities, masses, or tenderness Lungs: Normal respiratory effort, bilateral clear to auscultation, no crackles or wheezes.  Heart: Regular rate and rhythm, S1 and S2 normal, no murmurs, rubs auscultated Abdomen: BS normoactive,soft,nondistended,non-tender to palpation,no organomegaly Extremities: No pretibial edema, no erythema, no cyanosis, no  clubbing Neuro : Alert and oriented to time, place and person, No focal deficits Skin: No rashes seen on exam    Data Reviewed: I have personally reviewed following labs and imaging studies  CBG: No results for input(s): GLUCAP in the last 168 hours.  CBC:  Recent Labs Lab 02/19/17 0614 02/20/17 0436  WBC 5.3 6.2  HGB 11.2* 10.1*  HCT 32.6* 29.5*  MCV 100.6* 102.4*  PLT 153 149*    Basic Metabolic Panel:  Recent Labs Lab 02/19/17 0614 02/19/17 0634 02/20/17 0436  NA 132*  --  132*  K 2.6*  --  3.2*  CL 99*  --  100*  CO2 24  --  24  GLUCOSE 108*  --  84  BUN 13  --  14  CREATININE 0.89  --  0.81  CALCIUM 7.9*  --  7.9*  MG  --  1.6*  --     Recent Results (from the past 240 hour(s))  Culture, blood (routine x 2) Call MD if unable to obtain prior to antibiotics being given     Status: None (Preliminary result)   Collection Time: 02/19/17  1:03 PM  Result Value Ref Range Status   Specimen Description BLOOD LEFT HAND  Final   Special Requests   Final    BOTTLES DRAWN AEROBIC ONLY Blood Culture adequate volume   Culture NO GROWTH < 24 HOURS  Final   Report Status PENDING  Incomplete  Culture, blood (routine x 2) Call MD if unable to obtain prior to antibiotics being given     Status: None (Preliminary result)   Collection Time: 02/19/17  1:03 PM  Result Value Ref Range Status   Specimen Description BLOOD RIGHT ARM  Final   Special Requests   Final    BOTTLES DRAWN AEROBIC AND ANAEROBIC Blood Culture adequate volume   Culture NO GROWTH < 24 HOURS  Final   Report Status PENDING  Incomplete     Liver Function Tests:  Recent Labs Lab 02/19/17 0614 02/20/17 0436  AST 26 20  ALT 9* 6*  ALKPHOS 96 84  BILITOT 4.2* 3.2*  PROT 6.8 6.0*  ALBUMIN 2.1* 1.8*   No results for input(s): LIPASE, AMYLASE in the last 168 hours.  Recent Labs Lab 02/19/17 0614  AMMONIA 22    Cardiac Enzymes: No results for input(s): CKTOTAL, CKMB, CKMBINDEX, TROPONINI in the  last 168 hours. BNP (last 3 results)  Recent Labs  07/05/16 1944  BNP 199.0*    ProBNP (last 3 results) No results for input(s): PROBNP in the last 8760 hours.    Studies: Dg Chest Port 1 View  Result Date: 02/19/2017 CLINICAL DATA:  Acute onset of left-sided chest pain and shortness of breath. Initial encounter. EXAM: PORTABLE CHEST 1 VIEW COMPARISON:  Chest radiograph performed 07/05/2016, and CTA of the chest performed 11/16/2016 FINDINGS: A small right pleural effusion is noted, with right  basilar airspace opacity. This may reflect asymmetric interstitial edema or pneumonia. The right lung is mildly hypoexpanded. No pneumothorax is seen. The cardiomediastinal silhouette is normal in size. No acute osseous abnormalities are identified. IMPRESSION: Small right pleural effusion, with right basilar airspace opacity. This may reflect asymmetric interstitial edema or pneumonia. Electronically Signed   By: Roanna Raider M.D.   On: 02/19/2017 06:32    Scheduled Meds: . enoxaparin (LOVENOX) injection  40 mg Subcutaneous Q24H  . midodrine  5 mg Oral BID WC  . potassium chloride  40 mEq Oral Q4H  . thiamine  100 mg Oral Daily      Time spent: 25 min  [facility]   [facility] Pager [phone]. If 7PM-7AM, please contact night-coverage at [email or web address], Office  [phone]  password [email or web address]  02/20/2017, 11:19 AM  LOS: 1 day

## 2017-02-20 NOTE — Progress Notes (Signed)
Patient urine sample inadequate.  Will be recollected.

## 2017-02-20 NOTE — Progress Notes (Signed)
Pt has had 100cc tea colored urine output since 0700 this shift.  Bladder scan x 2.  Both scans revealed no more than 500cc in bladder.  Dr. Sharl Ma contacted.  Order for Foley Catheter.  Foley 67F inserted with minimal tea colored urine returned.  UC sent to Lab.  Will Report to oncoming shift.

## 2017-02-20 NOTE — Care Management Note (Addendum)
Case Management Note  Patient Details  Name: Chris Hicks MRN: [id number] Date of Birth: 02-18-1951  Subjective/Objective: Adm with CAP. Patient answers questions and carriers on a conversation appropriately but answers seem not to be valid. He states he still works. Asked if I could contact his family listed on chart and he agrees. CM called [name], listed as the spouse. She reports that she is separated from [name]. She does report that patient lives with his daughter. That he does not work, retired about a year ago. She reports he hasn't been able to apply for Medicaid as he draws in about $1700/month. He does not have any insurance.         Action/Plan: CM will discuss dispo with daughter. Anticipate DC home as he will not qualify for any type of placement without insurance, unless daughter would like to pay privately.   ADDENDUM 02/21/2017: Met with patient's daughter at bedside. She reports patient lives with her, has been with her for about a month. He was living with his son in [place] previously. Daughter pays OOP to have family/friends to stay with patient while she is working. She reports patient has been sober for about 5 months, but does have confusion at times related to his heavy drinking in the past. His son is moving back to [place] to help out with patient's care. Patient does not have current PCP (listed as having Dr. Melina Copa). CM provided PCP/clinic list to daughter and discussed [facility] in [place] which is close to patient. She understands she needs to schedule an appt to discuss payment (sliding scale) and then an appt can be made. CM also discussed [facility]. Daughter is agreeable to having referral sent to have patient assessed by [facility] for eligibility. Financial counselor has visited with daughter and plans to help patient apply for Medicaid and charity program if possible.  Expected Discharge Date:     02/21/2017             Expected  Discharge Plan:  Home/Self Care  In-House Referral:     Discharge planning Services  CM Consult  Post Acute Care Choice:    Choice offered to:     DME Arranged:    DME Agency:     HH Arranged:    HH Agency:     Status of Service:  In process, will continue to follow  If discussed at Long Length of Stay Meetings, dates discussed:    Additional Comments:  Marlyss Cissell, Chauncey Reading, RN 02/20/2017, 2:36 PM

## 2017-02-21 DIAGNOSIS — E440 Moderate protein-calorie malnutrition: Secondary | ICD-10-CM | POA: Diagnosis present

## 2017-02-21 LAB — STREP PNEUMONIAE URINARY ANTIGEN: STREP PNEUMO URINARY ANTIGEN: NEGATIVE

## 2017-02-21 MED ORDER — POTASSIUM CHLORIDE CRYS ER 20 MEQ PO TBCR
40.0000 meq | EXTENDED_RELEASE_TABLET | Freq: Once | ORAL | Status: AC
  Administered 2017-02-21: 40 meq via ORAL
  Filled 2017-02-21: qty 2

## 2017-02-21 MED ORDER — GLUCERNA SHAKE PO LIQD
237.0000 mL | Freq: Three times a day (TID) | ORAL | Status: DC
  Administered 2017-02-21 – 2017-02-24 (×8): 237 mL via ORAL

## 2017-02-21 MED ORDER — PRO-STAT SUGAR FREE PO LIQD
30.0000 mL | Freq: Two times a day (BID) | ORAL | Status: DC
  Administered 2017-02-21 – 2017-02-24 (×7): 30 mL via ORAL
  Filled 2017-02-21 (×7): qty 30

## 2017-02-21 MED ORDER — ADULT MULTIVITAMIN W/MINERALS CH
1.0000 | ORAL_TABLET | Freq: Every day | ORAL | Status: DC
  Administered 2017-02-21 – 2017-02-24 (×4): 1 via ORAL
  Filled 2017-02-21 (×4): qty 1

## 2017-02-21 MED ORDER — LEVOFLOXACIN 750 MG PO TABS
750.0000 mg | ORAL_TABLET | Freq: Every day | ORAL | Status: DC
  Administered 2017-02-21 – 2017-02-24 (×4): 750 mg via ORAL
  Filled 2017-02-21 (×4): qty 1

## 2017-02-21 MED ORDER — SODIUM CHLORIDE 0.9 % IV SOLN
INTRAVENOUS | Status: AC
  Administered 2017-02-21: 20:00:00 via INTRAVENOUS

## 2017-02-21 NOTE — Progress Notes (Signed)
Initial Nutrition Assessment  DOCUMENTATION CODES:   Non-severe (moderate) malnutrition in context of chronic illness  INTERVENTION:  If pt is not discharged today as planned: Downgrade diet to Mechanical soft due to edentulism   Add- ProStat 30 ml TID (each 30 ml provides 100 kcal, 15 gr protein)   Provide: Glucerna Shake po TID, each supplement provides 220 kcal and 10 grams of protein   Last BM -10/6 ??? Concern for constipation.  NUTRITION DIAGNOSIS:   Malnutrition (non-severe) related to chronic illness, poor appetite (cirrhosis) as evidenced by moderate depletions of muscle mass, moderate depletion of body fat.  GOAL:   Patient will meet greater than or equal to 90% of their needs   MONITOR:   PO intake, Supplement acceptance, Labs, Weight trends, Skin, I & O's  REASON FOR ASSESSMENT:   Consult Assessment of nutrition requirement/status  ASSESSMENT: Mr Joost is an overweight 66 yo male with a hx of alcohol related cirrhosis, chronic knee pain. The patient says he lives with wife.   Diet hx: home diet is regular. His lunch is here and only 25% consumed, no record for breakfast. Yesterday intake 50-75% of meals. Today he is not eating very well. There are no family present to assist with intake hx but pt says he drinks 6-7 beers every day. Reviewed protein needs with pt and we talked about foods that are good sources. Also discussed the importance for him to eat small frequent meals 5-6 times daily. Encouraged him to take a MVI and consume omega-three rich foods.  Weight hx: he reports usual wt of 250 lbs but this is not congruent with hospital records. According to chart he weighed 280 lbs in February (15%) loss in 8 months.  His current wt is likely skewed due to his edema.     Nutrition-Focused physical exam completed. Moderate orbital fat depletion with dark circles around both eyes, moderate muscle depletion upper body (deltiods, clavicles, temporal), and general  edema bilateral upper and lower extremities. Hair is wiry, nails are dirty. The patient has no teeth, gums are pink.     Labs: Albumin 1.8 , sodium 132 and potassium 3.2 , Mag 1.6   Meds: vitamin B-1, levaquin  Diet Order:  Diet Heart Room service appropriate? Yes; Fluid consistency: Thin  Skin:   (multiple abrasions knee and left arm)  Last BM:  10/6   Height:   Ht Readings from Last 1 Encounters:  02/19/17  (1.981 m)    Weight:   Wt Readings from Last 1 Encounters:  02/20/17 238 lb 5.1 oz (108.1 kg)    Ideal Body Weight:  98 kg  BMI:  Body mass index is 27.54 kg/m.  Estimated Nutritional Needs:   Kcal:  2200-2400   Protein:  137-147 gr  Fluid:  per MD goals  EDUCATION NEEDS:  Handouts focused on  identifying protein goal, sources and ways to purchase affordably   Education needs addressed (related to protein needs)    Royann Shivers MS,RD,CSG,LDN Office: [phone] Pager: [phone]

## 2017-02-21 NOTE — Evaluation (Signed)
Clinical/Bedside Swallow Evaluation Patient Details  Name: Chris Hicks MRN: [id number] Date of Birth: 1950-07-07  Today's Date: 02/21/2017 Time: SLP Start Time (ACUTE ONLY): 1240 SLP Stop Time (ACUTE ONLY): 1307 SLP Time Calculation (min) (ACUTE ONLY): 27 min  Past Medical History:  Past Medical History:  Diagnosis Date  . Chronic back pain   . Chronic knee pain   . Hypertension   . Lumbar radiculopathy    Past Surgical History:  Past Surgical History:  Procedure Laterality Date  . BACK SURGERY    . SPLENECTOMY     HPI:  66 y.o.male,With history of liver cirrhosis, hypertension, long-standing history of alcohol abuse, quit 6 months ago came to hospital with complaints of left-sided chest pain, cough with yellow phlegm. Patient describes pain as sharp. Symptoms worse on deep breathing. He also complains of shortness of breath   Assessment / Plan / Recommendation Clinical Impression  Clinical swallow evaluation completed at bedside. Pt is an unreliable historian and presents with confusion. 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 Pt initially stated that he sometimes had trouble swallowing and then stated that he did not. Oral motor examination was unremarkable except for edentulous status (states that he has dentures at home). His lunch tray sat untouched beside him. Pt without overt signs or symptoms of aspiration during assessment. SLP repositioned lunch tray and cut up all meats on his plate and Pt self fed crackers, bread, and fruit. Pt reported decreased appetite. Recommend regular textures (and chopped meats) and thin liquids with intermittent supervision due to cognitive status. No further SLP services indicated at this time. SLP will sign off. Reconsult if needed.   SLP Visit Diagnosis: Dysphagia, oropharyngeal phase (R13.12)    Aspiration Risk  Mild aspiration risk    Diet Recommendation Regular;Thin liquid (chop meats  due to edentulous status)   Liquid Administration via: Cup;Straw Medication Administration: Whole meds with liquid Supervision: Patient able to self feed Postural Changes: Seated upright at 90 degrees;Remain upright for at least 30 minutes after po intake    Other  Recommendations Oral Care Recommendations: Oral care BID Other Recommendations: Clarify dietary restrictions   Follow up Recommendations 24 hour supervision/assistance      Frequency and Duration            Prognosis Prognosis for Safe Diet Advancement: Good Barriers to Reach Goals: Cognitive deficits      Swallow Study   General Date of Onset: 02/19/17 HPI: 66 y.o.male,With history of liver cirrhosis, hypertension, long-standing history of alcohol abuse, quit 6 months ago came to hospital with complaints of left-sided chest pain, cough with yellow phlegm. Patient describes pain as sharp. Symptoms worse on deep breathing. He also complains of shortness of breath Type of Study: Bedside Swallow Evaluation Previous Swallow Assessment: None on record Diet Prior to this Study: Regular;Thin liquids Temperature Spikes Noted: No Respiratory Status: Room air History of Recent Intubation: No Behavior/Cognition: Alert;Cooperative;Confused Oral Cavity Assessment: Within Functional Limits Oral Care Completed by SLP: No Oral Cavity - Dentition: Edentulous (states that he has dentures at home) Vision: Functional for self-feeding Self-Feeding Abilities: Able to feed self (required assist to cut up meats) Patient Positioning: Upright in bed Baseline Vocal Quality: Normal Volitional Cough: Strong Volitional Swallow: Able to elicit    Oral/Motor/Sensory Function Overall Oral Motor/Sensory Function: Within functional limits   Ice Chips Ice chips: Within functional limits Presentation: Spoon   Thin Liquid Thin Liquid: Within functional limits  Presentation: Cup;Self Fed;Straw    Nectar Thick Nectar Thick Liquid: Not tested    Honey Thick Honey Thick Liquid: Not tested   Puree Puree: Within functional limits Presentation: Spoon   Solid   Thank you,  Havery Moros, CCC-SLP [phone]    Solid: Within functional limits (required chopped meats) Presentation: Self Fed        PORTER,DABNEY 02/21/2017,5:47 PM

## 2017-02-21 NOTE — Progress Notes (Signed)
PROGRESS NOTE                                                                                                                                                                                                             Patient Demographics:    Chris Hicks, is a 66 y.o. male, DOB - [birth year], ZOX:[id number]  Admit date - 02/19/2017   Admitting Physician Meredeth Ide, MD  Outpatient Primary MD for the patient is No primary care provider on file.  LOS - 2  Outpatient Specialists:None  Chief Complaint  Patient presents with  . 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    Subjective:   Patient still having some cough but breathing much better. He reports occasional choking on his food.   Assessment  & Plan :   Principal problem Right lobar pneumonia Community-acquired vs aspiration. Cultures negative. Continue empiric Levaquin. Obtain swallow evaluation. Supportive care with antitussives.  Hypokalemia/hypomagnesemia Replenished.   Prolonged QTC on admission Resolved. Monitor while on Levaquin. Also had sinus tachycardia with PACs. No further issues.  Alcoholic liver disease Elevated bilirubin of 4.2 (indirect hyperbilirubinemia). Otherwise no decompensation. Monitor for now.  Moderate protein calorie malnutrition Appreciate nutrition consult and added supplement.  Oliguria No signs of obstruction or UTI. Foley placed on 10/9. Will monitor with IV fluids. Check BMET in a.m.   Anemia Suspected due to chronic disease. 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 Check iron panel and B12 in a.m.  Case manager will arrange outpatient [facility] and a referral sent. Also referral sent to [facility] in [place]. Patient does not  have a PCP at present.  Code Status : Full code  Family Communication  : Daughter updated on the phone  Disposition Plan  : Home tomorrow if urine output improved  Barriers For Discharge : Oliguria, improving symptoms, pending SLP eval  Consults  : None  Procedures  : None  DVT Prophylaxis  :  Lovenox -  Lab Results  Component Value Date   PLT 149 (L) 02/20/2017    Antibiotics  :    Anti-infectives    Start     Dose/Rate  Route Frequency Ordered Stop   02/21/17 1000  levofloxacin (LEVAQUIN) tablet 750 mg     750 mg Oral Daily 02/21/17 0814     02/20/17 0800  levofloxacin (LEVAQUIN) IVPB 750 mg  Status:  Discontinued     750 mg 100 mL/hr over 90 Minutes Intravenous Every 24 hours 02/19/17 1145 02/21/17 0814   02/19/17 0645  levofloxacin (LEVAQUIN) IVPB 750 mg     750 mg 100 mL/hr over 90 Minutes Intravenous  Once 02/19/17 0643 02/19/17 0857        Objective:   Vitals:   02/20/17 1555 02/20/17 2154 02/21/17 0653 02/21/17 1500  BP: 117/83 127/85 121/87 114/83  Pulse: 95 (!) 109 99 97  Resp: Temp: 98.5 F (36.9 C) 98.3 F (36.8 C) 98.9 F (37.2 C) 98.7 F (37.1 C)  TempSrc: Oral Oral Oral Oral  SpO2: 95% 95% 96% 98%  Weight:      Height:        Wt Readings from Last 3 Encounters:  02/20/17 108.1 kg (238 lb 5.1 oz)  07/05/16 127 kg (280 lb)  05/03/15 129.3 kg (285 lb)     Intake/Output Summary (Last 24 hours) at 02/21/17 1547 Last data filed at 02/21/17 0700  Gross per 24 hour  Intake           1297.5 ml  Output              525 ml  Net            772.5 ml     Physical Exam  Gen: not in distress HEENT: Pallor present, moist mucosa, supple neck Chest: Diminished breath sounds over right lung base CVS: N S1&S2, no murmurs, rubs or gallop GI: soft, NT, ND, BS+ Foley with clear urine Musculoskeletal: warm, no edema CNS: Alert and oriented, nonfocal    Data Review:    CBC  Recent Labs Lab 02/19/17 0614 02/20/17 0436  WBC 5.3  6.2  HGB 11.2* 10.1*  HCT 32.6* 29.5*  PLT 153 149*  MCV 100.6* 102.4*  MCH 34.6* 35.1*  MCHC 34.4 34.2  RDW 15.9* 16.7*    Chemistries   Recent Labs Lab 02/19/17 0614 02/19/17 0634 02/20/17 0436  NA 132*  --  132*  K 2.6*  --  3.2*  CL 99*  --  100*  CO2 24  --  24  GLUCOSE 108*  --  84  BUN 13  --  14  CREATININE 0.89  --  0.81  CALCIUM 7.9*  --  7.9*  MG  --  1.6*  --   AST 26  --  20  ALT 9*  --  6*  ALKPHOS 96  --  84  BILITOT 4.2*  --  3.2*   ------------------------------------------------------------------------------------------------------------------ No results for input(s): CHOL, HDL, LDLCALC, TRIG, CHOLHDL, LDLDIRECT in the last 72 hours.  No results found for: HGBA1C ------------------------------------------------------------------------------------------------------------------ No results for input(s): TSH, T4TOTAL, T3FREE, THYROIDAB in the last 72 hours.  Invalid input(s): FREET3 ------------------------------------------------------------------------------------------------------------------ No results for input(s): VITAMINB12, FOLATE, FERRITIN, TIBC, IRON, RETICCTPCT in the last 72 hours.  Coagulation profile  Recent Labs Lab 02/19/17 0614  INR 1.52    No results for input(s): DDIMER in the last 72 hours.  Cardiac Enzymes No results for input(s): CKMB, TROPONINI, MYOGLOBIN in the last 168 hours.  Invalid input(s): CK ------------------------------------------------------------------------------------------------------------------    Component Value Date/Time   BNP 199.0 (H) 07/05/2016 1944    Inpatient Medications  Scheduled  Meds: . enoxaparin (LOVENOX) injection  40 mg Subcutaneous Q24H  . feeding supplement (GLUCERNA SHAKE)  237 mL Oral TID BM  . feeding supplement (PRO-STAT SUGAR FREE 64)  30 mL Oral BID  . levofloxacin  750 mg Oral Daily  . midodrine  5 mg Oral BID WC  . multivitamin with minerals  1 tablet Oral Daily  .  thiamine  100 mg Oral Daily   Continuous Infusions: . sodium chloride     PRN Meds:.  Micro Results Recent Results (from the past 240 hour(s))  Culture, blood (routine x 2) Call MD if unable to obtain prior to antibiotics being given     Status: None (Preliminary result)   Collection Time: 02/19/17  1:03 PM  Result Value Ref Range Status   Specimen Description BLOOD LEFT HAND  Final   Special Requests   Final    BOTTLES DRAWN AEROBIC ONLY Blood Culture adequate volume   Culture NO GROWTH 2 DAYS  Final   Report Status PENDING  Incomplete  Culture, blood (routine x 2) Call MD if unable to obtain prior to antibiotics being given     Status: None (Preliminary result)   Collection Time: 02/19/17  1:03 PM  Result Value Ref Range Status   Specimen Description BLOOD RIGHT ARM  Final   Special Requests   Final    BOTTLES DRAWN AEROBIC AND ANAEROBIC Blood Culture adequate volume   Culture NO GROWTH 2 DAYS  Final   Report Status PENDING  Incomplete    Radiology Reports Dg Chest Port 1 View  Result Date: 02/19/2017 CLINICAL DATA:  Acute onset of left-sided chest pain and shortness of breath. Initial encounter. EXAM: PORTABLE CHEST 1 VIEW COMPARISON:  Chest radiograph performed 07/05/2016, and CTA of the chest performed 11/16/2016 FINDINGS: A small right pleural effusion is noted, with right basilar airspace opacity. This may reflect asymmetric interstitial edema or pneumonia. The right lung is mildly hypoexpanded. No pneumothorax is seen. The cardiomediastinal silhouette is normal in size. No acute osseous abnormalities are identified. IMPRESSION: Small right pleural effusion, with right basilar airspace opacity. This may reflect asymmetric interstitial edema or pneumonia. Electronically Signed   By: Roanna Raider M.D.   On: 02/19/2017 06:32    Time Spent in minutes  25   Eddie North M.D on 02/21/2017 at 3:47 PM  Between 7am to 7pm - Pager - [phone]  After 7pm go to  [email or web address] - password [facility]  [facility] -  Office  [phone]

## 2017-02-22 ENCOUNTER — Inpatient Hospital Stay (HOSPITAL_COMMUNITY): Payer: Self-pay

## 2017-02-22 LAB — COMPREHENSIVE METABOLIC PANEL
ALK PHOS: 73 U/L (ref 38–126)
ALT: 6 U/L — AB (ref 17–63)
AST: 19 U/L (ref 15–41)
Albumin: 1.6 g/dL — ABNORMAL LOW (ref 3.5–5.0)
Anion gap: 5 (ref 5–15)
BUN: 14 mg/dL (ref 6–20)
CALCIUM: 7.7 mg/dL — AB (ref 8.9–10.3)
CO2: 23 mmol/L (ref 22–32)
CREATININE: 0.66 mg/dL (ref 0.61–1.24)
Chloride: 100 mmol/L — ABNORMAL LOW (ref 101–111)
Glucose, Bld: 87 mg/dL (ref 65–99)
Potassium: 3.9 mmol/L (ref 3.5–5.1)
Sodium: 128 mmol/L — ABNORMAL LOW (ref 135–145)
TOTAL PROTEIN: 5.6 g/dL — AB (ref 6.5–8.1)
Total Bilirubin: 2.2 mg/dL — ABNORMAL HIGH (ref 0.3–1.2)

## 2017-02-22 LAB — URINE CULTURE
Culture: NO GROWTH
SPECIAL REQUESTS: NORMAL

## 2017-02-22 LAB — IRON AND TIBC
IRON: 93 ug/dL (ref 45–182)
Saturation Ratios: 58 % — ABNORMAL HIGH (ref 17.9–39.5)
TIBC: 161 ug/dL — ABNORMAL LOW (ref 250–450)
UIBC: 68 ug/dL

## 2017-02-22 LAB — VITAMIN B12: Vitamin B-12: 922 pg/mL — ABNORMAL HIGH (ref 180–914)

## 2017-02-22 LAB — MAGNESIUM: MAGNESIUM: 1.7 mg/dL (ref 1.7–2.4)

## 2017-02-22 LAB — FERRITIN: Ferritin: 92 ng/mL (ref 24–336)

## 2017-02-22 MED ORDER — MAGNESIUM SULFATE 2 GM/50ML IV SOLN
2.0000 g | Freq: Once | INTRAVENOUS | Status: AC
  Administered 2017-02-22: 2 g via INTRAVENOUS
  Filled 2017-02-22: qty 50

## 2017-02-22 NOTE — Progress Notes (Signed)
[facility]  PROGRESS NOTE  Chris Hicks ZOX:[id number] DOB: 21-Nov-1950 DOA: 02/19/2017 PCP: No primary care provider on file.   Brief HPI:    66 y.o. male, With history of liver cirrhosis, hypertension, long-standing history of alcohol abuse, quit 6 months ago came to hospital with complaints of left-sided chest pain, cough with yellow phlegm. Patient describes pain as sharp. Symptoms worse on deep breathing. He also complains of shortness of breath the ED, chest x-ray showed right pleural effusion with right basilar airspace opacity which may reflect asymmetric interstitial edema or pneumonia. Patient empirically started on Levaquin for community-acquired pneumonia.   Subjective   Patient seen and examined, denies coughing up any phlegm. No shortness of breath. He was seen by speech therapy and started on regular diet.   Assessment/Plan:     1. Community acquired pneumonia- clinically improved, chest x-ray showed right basilar airspace opacity, patient started on Levaquin. Blood cultures 2 are negative to date. Strep pneumo urinary antigen Is negative. Continue Levaquin. 2. Hypokalemia- replete. 3. Hypomagnesemia-magnesium 1.6 on 02/20/2017 today is 1.7. Will replace magnesium. 4. Hyponatremia-sodium 128, dropped from 132 yesterday. Will repeat sodium in a.m. 5. Prolonged QTC-resolved, QTC was prolonged to 548 yesterday. Repeat EKG showed QTC of 454. Will continue to monitor on telemetry as patient is on Levaquin. 6. Sinus tachycardia with PACs-EKG showed sinus tachycardia with PACs. No atrial fibrillation ,called and confirmed with cardiology Dr. Charlton Haws. 7. Alcoholic liver disease-stable, no acute decompensation LFTs are normal. Bili 4.2 8. History of orthostatic hypotension-continue midodrine 5 mg by mouth twice a day. Blood pressure is stable.    DVT prophylaxis: Lovenox  Code Status: Full code   no family at bedside  Disposition Plan: Likely home in 1-2  days   Consultants:  None  Procedures:  None  Continuous infusions . sodium chloride 100 mL/hr at 02/21/17 1937      Antibiotics:   Anti-infectives    Start     Dose/Rate Route Frequency Ordered Stop   02/21/17 1000  levofloxacin (LEVAQUIN) tablet 750 mg     750 mg Oral Daily 02/21/17 0814     02/20/17 0800  levofloxacin (LEVAQUIN) IVPB 750 mg  Status:  Discontinued     750 mg 100 mL/hr over 90 Minutes Intravenous Every 24 hours 02/19/17 1145 02/21/17 0814   02/19/17 0645  levofloxacin (LEVAQUIN) IVPB 750 mg     750 mg 100 mL/hr over 90 Minutes Intravenous  Once 02/19/17 0643 02/19/17 0857       Objective   Vitals:   02/21/17 1500 02/21/17 2124 02/22/17 0607 02/22/17 1400  BP: 114/83  119/77 126/82  Pulse: 97  91 91  Resp: Temp: 98.7 F (37.1 C)  98.4 F (36.9 C) 98.5 F (36.9 C)  TempSrc: Oral  Oral Oral  SpO2: 98% 94% 98% 94%  Weight:      Height:        Intake/Output Summary (Last 24 hours) at 02/22/17 1448 Last data filed at 02/22/17 8119  Gross per 24 hour  Intake          1203.33 ml  Output              400 ml  Net           803.33 ml   Filed Weights   02/19/17 0600 02/19/17 1043 02/20/17 0546  Weight: 113.4 kg (250 lb) 108.1 kg (238 lb 5.1 oz) 108.1[name]238 lb 5.1 oz)  Physical Hicks:  Physical Exam: Eyes: No icterus, extraocular muscles intact  Mouth: Oral mucosa is moist, no lesions on palate,  Neck: Supple, no deformities, masses, or tenderness Lungs: Normal respiratory effort, bilateral clear to auscultation, no crackles or wheezes.  Heart: Regular rate and rhythm, S1 and S2 normal, no murmurs, rubs auscultated Abdomen: BS normoactive,soft,nondistended,non-tender to palpation,no organomegaly Extremities: No pretibial edema, no erythema, no cyanosis, no clubbing Neuro : Alert and oriented to time, place and person, No focal deficits Skin: No rashes seen on exam     Data Reviewed: I have personally reviewed  following labs and imaging studies  CBG: No results for input(s): GLUCAP in the last 168 hours.  CBC:  Recent Labs Lab 02/19/17 0614 02/20/17 0436  WBC 5.3 6.2  HGB 11.2* 10.1*  HCT 32.6* 29.5*  MCV 100.6* 102.4*  PLT 153 149*    Basic Metabolic Panel:  Recent Labs Lab 02/19/17 0614 02/19/17 0634 02/20/17 0436 02/22/17 0632  NA 132*  --  132* 128*  K 2.6*  --  3.2* 3.9  CL 99*  --  100* 100*  CO2 24  --  24 23  GLUCOSE 108*  --  84 87  BUN 13  --  14 14  CREATININE 0.89  --  0.81 0.66  CALCIUM 7.9*  --  7.9* 7.7*  MG  --  1.6*  --  1.7    Recent Results (from the past 240 hour(s))  Culture, blood (routine x 2) Call MD if unable to obtain prior to antibiotics being given     Status: None (Preliminary result)   Collection Time: 02/19/17  1:03 PM  Result Value Ref Range Status   Specimen Description BLOOD LEFT HAND  Final   Special Requests   Final    BOTTLES DRAWN AEROBIC ONLY Blood Culture adequate volume   Culture NO GROWTH 3 DAYS  Final   Report Status PENDING  Incomplete  Culture, blood (routine x 2) Call MD if unable to obtain prior to antibiotics being given     Status: None (Preliminary result)   Collection Time: 02/19/17  1:03 PM  Result Value Ref Range Status   Specimen Description BLOOD RIGHT ARM  Final   Special Requests   Final    BOTTLES DRAWN AEROBIC AND ANAEROBIC Blood Culture adequate volume   Culture NO GROWTH 3 DAYS  Final   Report Status PENDING  Incomplete  Culture, Urine     Status: None   Collection Time: 02/20/17  4:00 PM  Result Value Ref Range Status   Specimen Description URINE, CLEAN CATCH  Final   Special Requests Normal  Final   Culture   Final    NO GROWTH Performed at [facility], [address] [place], [place] [address]    Report Status 02/22/2017 FINAL  Final     Liver Function Tests:  Recent Labs Lab 02/19/17 [phone] 02/20/17 0436 02/22/17 0632  AST ALT 9* 6* 6*  ALKPHOS 96 84 73  BILITOT 4.2*  3.2* 2.2*  PROT 6.8 6.0* 5.6*  ALBUMIN 2.1* 1.8* 1.6*   No results for input(s): LIPASE, AMYLASE in the last 168 hours.  Recent Labs Lab 02/19/17 0614  AMMONIA 22    Cardiac Enzymes: No results for input(s): CKTOTAL, CKMB, CKMBINDEX, TROPONINI in the last 168 hours. BNP (last 3 results)  Recent Labs  07/05/16 1944  BNP 199.0*    ProBNP (last 3 results) No results for input(s): PROBNP  in the last 8760 hours.    Studies: US Renal  Result Date: 02/22/2017 CLINICAL DATA:  Oliguria.  Splenectomy. EXAM: RENAL / URINARY TRACT ULTRASOUND COMPLETE COMPARISON:  Ultrasound 12/08/2010 . FINDINGS: Right Kidney: Length: 12.0 cm. Echogenicity within normal limits. No mass or hydronephrosis visualized. Left Kidney: Length: 12.4 cm. Echogenicity within normal limits. No mass or hydronephrosis visualized. Bladder: Foley catheter noted. Ureteral jets not visualized. Ascites noted. Left pleural effusion noted. IMPRESSION: 1.  No acute renal abnormality identified.  No hydronephrosis. 2.  Prominent ascites.  Left pleural effusion noted. Electronically Signed   By: Maisie Fus  Register   On: 02/22/2017 11:14    Scheduled Meds: . enoxaparin (LOVENOX) injection  40 mg Subcutaneous Q24H  . feeding supplement (GLUCERNA SHAKE)  237 mL Oral TID BM  . feeding supplement (PRO-STAT SUGAR FREE 64)  30 mL Oral BID  . levofloxacin  750 mg Oral Daily  . midodrine  5 mg Oral BID WC  . multivitamin with minerals  1 tablet Oral Daily  . thiamine  100 mg Oral Daily      Time spent: 25 min  [facility]   [facility] Pager [phone]. If 7PM-7AM, please contact night-coverage at [email or web address], Office  [phone]  password [email or web address]  02/22/2017, 2:48 PM  LOS: 3 days

## 2017-02-23 DIAGNOSIS — E871 Hypo-osmolality and hyponatremia: Secondary | ICD-10-CM

## 2017-02-23 DIAGNOSIS — E440 Moderate protein-calorie malnutrition: Secondary | ICD-10-CM

## 2017-02-23 DIAGNOSIS — R34 Anuria and oliguria: Secondary | ICD-10-CM

## 2017-02-23 LAB — BASIC METABOLIC PANEL
Anion gap: 5 (ref 5–15)
BUN: 15 mg/dL (ref 6–20)
CO2: 21 mmol/L — ABNORMAL LOW (ref 22–32)
CREATININE: 0.66 mg/dL (ref 0.61–1.24)
Calcium: 7.7 mg/dL — ABNORMAL LOW (ref 8.9–10.3)
Chloride: 101 mmol/L (ref 101–111)
Glucose, Bld: 107 mg/dL — ABNORMAL HIGH (ref 65–99)
Potassium: 3.8 mmol/L (ref 3.5–5.1)
SODIUM: 127 mmol/L — AB (ref 135–145)

## 2017-02-23 LAB — OSMOLALITY: OSMOLALITY: 277 mosm/kg (ref 275–295)

## 2017-02-23 MED ORDER — ALBUTEROL SULFATE (2.5 MG/3ML) 0.083% IN NEBU: 2.5000 mg | INHALATION_SOLUTION | RESPIRATORY_TRACT | Status: DC | PRN

## 2017-02-23 MED ORDER — ALBUTEROL SULFATE (2.5 MG/3ML) 0.083% IN NEBU
INHALATION_SOLUTION | RESPIRATORY_TRACT | Status: AC
  Administered 2017-02-23: 2.5 mg
  Filled 2017-02-23: qty 3

## 2017-02-23 MED ORDER — TAMSULOSIN HCL 0.4 MG PO CAPS
0.4000 mg | ORAL_CAPSULE | Freq: Every day | ORAL | Status: DC
  Administered 2017-02-23: 0.4 mg via ORAL
  Filled 2017-02-23: qty 1

## 2017-02-23 MED ORDER — FUROSEMIDE 20 MG PO TABS
20.0000 mg | ORAL_TABLET | Freq: Every day | ORAL | Status: DC
  Administered 2017-02-23 – 2017-02-24 (×2): 20 mg via ORAL
  Filled 2017-02-23 (×2): qty 1

## 2017-02-23 NOTE — Progress Notes (Addendum)
[facility]  PROGRESS NOTE  Chris Hicks [facility] WUJ:[id number] DOB: 10/21/1950 DOA: 02/19/2017 PCP: No primary care provider on file.   Brief HPI:    66 y.o. male, With history of liver cirrhosis, hypertension, long-standing history of alcohol abuse, quit 6 months ago came to hospital with complaints of left-sided chest pain, cough with yellow phlegm. Patient describes pain as sharp. Symptoms worse on deep breathing. He also complains of shortness of breath the ED, chest x-ray showed right pleural effusion with right basilar airspace opacity which may reflect asymmetric interstitial edema or pneumonia. Patient empirically started on Levaquin for community-acquired pneumonia.   Subjective   Patient seen and examined, denies shortness of breath. Passed swallow evaluation. Started on regular diet.   Assessment/Plan:     1. Community acquired pneumonia- clinically improved, chest x-ray showed right basilar airspace opacity, patient started on Levaquin. Blood cultures 2 are negative to date. Strep pneumo urinary antigen Is negative. Continue Levaquin. 2. Hypokalemia- replete. 3. Hypomagnesemia-Magnesium was 1.7 yesterday, was replaced. Recheck magnesium in a.m. 4. Hyponatremia-sodium 127, dropped from 128 yesterday. Patient does have history of liver cirrhosis. Renal ultrasound shows moderate ascites. We'll start fluid restriction 1500 mL per day. Will check serum osmolarity. 5. Prolonged QTC-resolved, QTC was prolonged to 548 yesterday. Repeat EKG showed QTC of 454. Will continue to monitor on telemetry as patient is on Levaquin. 6.  ?BPH- patient continues to complain of hesitancy  of urination, will start Flomax 0.4 mg daily at bedtime. 7. Sinus tachycardia with PACs-EKG showed sinus tachycardia with PACs. No atrial fibrillation ,called and confirmed with cardiology Dr. Charlton Haws. 8. Alcoholic liver disease-stable, no acute decompensation LFTs are normal. Bili 4.2. Ultrasound of  kidneys shows moderate ascites. We'll start Lasix 20 milli grams by mouth daily. 9. History of orthostatic hypotension-continue midodrine 5 mg by mouth twice a day. Blood pressure is stable.    DVT prophylaxis: Lovenox  Code Status: Full code   no family at bedside  Disposition Plan: Likely home in 1-2 days   Consultants:  None  Procedures:  None  Continuous infusions     Antibiotics:   Anti-infectives    Start     Dose/Rate Route Frequency Ordered Stop   02/21/17 1000  levofloxacin (LEVAQUIN) tablet 750 mg     750 mg Oral Daily 02/21/17 0814     02/20/17 0800  levofloxacin (LEVAQUIN) IVPB 750 mg  Status:  Discontinued     750 mg 100 mL/hr over 90 Minutes Intravenous Every 24 hours 02/19/17 1145 02/21/17 0814   02/19/17 0645  levofloxacin (LEVAQUIN) IVPB 750 mg     750 mg 100 mL/hr over 90 Minutes Intravenous  Once 02/19/17 0643 02/19/17 0857       Objective   Vitals:   02/22/17 2131 02/23/17 0441 02/23/17 1038 02/23/17 1134  BP: 122/79 128/86    Pulse: 97 (!) 110    Resp: 18 18    Temp: 98.3 F (36.8 C) 97.8 F (36.6 C)    TempSrc: Oral Oral    SpO2: 94% 93% 98% 96%  Weight:      Height:        Intake/Output Summary (Last 24 hours) at 02/23/17 1255 Last data filed at 02/23/17 0900  Gross per 24 hour  Intake              240 ml  Output              601 ml  Net             -  361 ml   Filed Weights   02/19/17 0600 02/19/17 1043 02/20/17 0546  Weight: 113.4 kg (250 lb) 108.1 kg (238 lb 5.1 oz) 108.1 kg (238 lb 5.1 oz)     Physical Examination:  Physical Exam: Eyes: No icterus, extraocular muscles intact  Mouth: Oral mucosa is moist, no lesions on palate,  Neck: Supple, no deformities, masses, or tenderness Lungs: Normal respiratory effort, bilateral clear to auscultation, no crackles or wheezes.  Heart: Regular rate and rhythm, S1 and S2 normal, no murmurs, rubs auscultated Abdomen: BS normoactive,soft,nondistended,non-tender to palpation,no  organomegaly Extremities: Bilateral 2+ pitting edema of lower extremities Neuro : Alert and oriented to time, place and person, No focal deficits Skin: No rashes seen on exam     Data Reviewed: I have personally reviewed following labs and imaging studies  CBG: No results for input(s): GLUCAP in the last 168 hours.  CBC:  Recent Labs Lab 02/19/17 0614 02/20/17 0436  WBC 5.3 6.2  HGB 11.2* 10.1*  HCT 32.6* 29.5*  MCV 100.6* 102.4*  PLT 153 149*    Basic Metabolic Panel:  Recent Labs Lab 02/19/17 0614 02/19/17 0634 02/20/17 0436 02/22/17 0632 02/23/17 0458  NA 132*  --  132* 128* 127*  K 2.6*  --  3.2* 3.9 3.8  CL 99*  --  100* 100* 101  CO2 24  --  24 23 21*  GLUCOSE 108*  --  84 87 107*  BUN 13  --  CREATININE 0.89  --  0.81 0.66 0.66  CALCIUM 7.9*  --  7.9* 7.7* 7.7*  MG  --  1.6*  --  1.7  --     Recent Results (from the past 240 hour(s))  Culture, blood (routine x 2) Call MD if unable to obtain prior to antibiotics being given     Status: None (Preliminary result)   Collection Time: 02/19/17  1:03 PM  Result Value Ref Range Status   Specimen Description BLOOD LEFT HAND  Final   Special Requests   Final    BOTTLES DRAWN AEROBIC ONLY Blood Culture adequate volume   Culture NO GROWTH 4 DAYS  Final   Report Status PENDING  Incomplete  Culture, blood (routine x 2) Call MD if unable to obtain prior to antibiotics being given     Status: None (Preliminary result)   Collection Time: 02/19/17  1:03 PM  Result Value Ref Range Status   Specimen Description BLOOD RIGHT ARM  Final   Special Requests   Final    BOTTLES DRAWN AEROBIC AND ANAEROBIC Blood Culture adequate volume   Culture NO GROWTH 4 DAYS  Final   Report Status PENDING  Incomplete  Culture, Urine     Status: None   Collection Time: 02/20/17  4:00 PM  Result Value Ref Range Status   Specimen Description URINE, CLEAN CATCH  Final   Special Requests Normal  Final   Culture   Final    NO  GROWTH Performed at [facility], [address] [place], [place] [address]    Report Status 02/22/2017 FINAL  Final     Liver Function Tests:  Recent Labs Lab 02/19/17 [phone] 02/20/17 0436 02/22/17 0632  AST ALT 9* 6* 6*  ALKPHOS 96 84 73  BILITOT 4.2* 3.2* 2.2*  PROT 6.8 6.0* 5.6*  ALBUMIN 2.1* 1.8* 1.6*   No results for input(s): LIPASE, AMYLASE in the last 168 hours.  Recent Labs Lab 02/19/17  8119  AMMONIA 22    Cardiac Enzymes: No results for input(s): CKTOTAL, CKMB, CKMBINDEX, TROPONINI in the last 168 hours. BNP (last 3 results)  Recent Labs  07/05/16 1944  BNP 199.0*    ProBNP (last 3 results) No results for input(s): PROBNP in the last 8760 hours.    Studies: US Renal  Result Date: 02/22/2017 CLINICAL DATA:  Oliguria.  Splenectomy. EXAM: RENAL / URINARY TRACT ULTRASOUND COMPLETE COMPARISON:  Ultrasound 12/08/2010 . FINDINGS: Right Kidney: Length: 12.0 cm. Echogenicity within normal limits. No mass or hydronephrosis visualized. Left Kidney: Length: 12.4 cm. Echogenicity within normal limits. No mass or hydronephrosis visualized. Bladder: Foley catheter noted. Ureteral jets not visualized. Ascites noted. Left pleural effusion noted. IMPRESSION: 1.  No acute renal abnormality identified.  No hydronephrosis. 2.  Prominent ascites.  Left pleural effusion noted. Electronically Signed   By: Maisie Fus  Register   On: 02/22/2017 11:14    Scheduled Meds: . enoxaparin (LOVENOX) injection  40 mg Subcutaneous Q24H  . feeding supplement (GLUCERNA SHAKE)  237 mL Oral TID BM  . feeding supplement (PRO-STAT SUGAR FREE 64)  30 mL Oral BID  . furosemide  20 mg Oral Daily  . levofloxacin  750 mg Oral Daily  . midodrine  5 mg Oral BID WC  . multivitamin with minerals  1 tablet Oral Daily  . tamsulosin  0.4 mg Oral QPC supper  . thiamine  100 mg Oral Daily      Time spent: 25 min  [facility]   [facility] Pager [phone]. If 7PM-7AM, please  contact night-coverage at [email or web address], Office  [phone]  password [email or web address]  02/23/2017, 12:55 PM  LOS: 4 days

## 2017-02-23 NOTE — Progress Notes (Signed)
**Note De-identified Chris Hicks Obfuscation** EKG complete RN notified  and placed in patient chart.  

## 2017-02-24 ENCOUNTER — Inpatient Hospital Stay (HOSPITAL_COMMUNITY): Payer: Self-pay

## 2017-02-24 DIAGNOSIS — E871 Hypo-osmolality and hyponatremia: Secondary | ICD-10-CM | POA: Diagnosis present

## 2017-02-24 DIAGNOSIS — K7030 Alcoholic cirrhosis of liver without ascites: Secondary | ICD-10-CM | POA: Diagnosis present

## 2017-02-24 DIAGNOSIS — J690 Pneumonitis due to inhalation of food and vomit: Secondary | ICD-10-CM | POA: Diagnosis present

## 2017-02-24 DIAGNOSIS — R1311 Dysphagia, oral phase: Secondary | ICD-10-CM | POA: Diagnosis present

## 2017-02-24 DIAGNOSIS — J189 Pneumonia, unspecified organism: Secondary | ICD-10-CM

## 2017-02-24 DIAGNOSIS — K7031 Alcoholic cirrhosis of liver with ascites: Secondary | ICD-10-CM

## 2017-02-24 DIAGNOSIS — I951 Orthostatic hypotension: Secondary | ICD-10-CM | POA: Diagnosis not present

## 2017-02-24 DIAGNOSIS — E876 Hypokalemia: Secondary | ICD-10-CM | POA: Diagnosis present

## 2017-02-24 DIAGNOSIS — J181 Lobar pneumonia, unspecified organism: Secondary | ICD-10-CM

## 2017-02-24 LAB — CULTURE, BLOOD (ROUTINE X 2)
Culture: NO GROWTH
Culture: NO GROWTH
SPECIAL REQUESTS: ADEQUATE
Special Requests: ADEQUATE

## 2017-02-24 LAB — COMPREHENSIVE METABOLIC PANEL
ALT: 8 U/L — AB (ref 17–63)
AST: 23 U/L (ref 15–41)
Albumin: 1.6 g/dL — ABNORMAL LOW (ref 3.5–5.0)
Alkaline Phosphatase: 63 U/L (ref 38–126)
Anion gap: 5 (ref 5–15)
BILIRUBIN TOTAL: 1.7 mg/dL — AB (ref 0.3–1.2)
BUN: 15 mg/dL (ref 6–20)
CALCIUM: 7.6 mg/dL — AB (ref 8.9–10.3)
CHLORIDE: 102 mmol/L (ref 101–111)
CO2: 22 mmol/L (ref 22–32)
Creatinine, Ser: 0.69 mg/dL (ref 0.61–1.24)
Glucose, Bld: 87 mg/dL (ref 65–99)
Potassium: 4 mmol/L (ref 3.5–5.1)
Sodium: 129 mmol/L — ABNORMAL LOW (ref 135–145)
Total Protein: 5.3 g/dL — ABNORMAL LOW (ref 6.5–8.1)

## 2017-02-24 LAB — MAGNESIUM: MAGNESIUM: 1.8 mg/dL (ref 1.7–2.4)

## 2017-02-24 MED ORDER — GLUCERNA SHAKE PO LIQD: 237.0000 mL | Freq: Three times a day (TID) | ORAL | 0 refills | Status: AC

## 2017-02-24 MED ORDER — AMOXICILLIN-POT CLAVULANATE 875-125 MG PO TABS: 1.0000 | ORAL_TABLET | Freq: Two times a day (BID) | ORAL | 0 refills | Status: AC

## 2017-02-24 MED ORDER — FUROSEMIDE 20 MG PO TABS: 20.0000 mg | ORAL_TABLET | Freq: Every day | ORAL | 0 refills | Status: DC

## 2017-02-24 MED ORDER — TAMSULOSIN HCL 0.4 MG PO CAPS: 0.4000 mg | ORAL_CAPSULE | Freq: Every day | ORAL | 0 refills | Status: AC

## 2017-02-24 MED ORDER — FUROSEMIDE 20 MG PO TABS: 40.0000 mg | ORAL_TABLET | Freq: Every day | ORAL | 0 refills | Status: DC

## 2017-02-24 MED ORDER — PRO-STAT SUGAR FREE PO LIQD: 30.0000 mL | Freq: Two times a day (BID) | ORAL | 0 refills | Status: AC

## 2017-02-24 MED ORDER — FUROSEMIDE 40 MG PO TABS: 40.0000 mg | ORAL_TABLET | Freq: Every day | ORAL | 0 refills | Status: AC

## 2017-02-24 MED ORDER — ADULT MULTIVITAMIN W/MINERALS CH: 1.0000 | ORAL_TABLET | Freq: Every day | ORAL | 0 refills | Status: AC

## 2017-02-24 NOTE — Discharge Summary (Signed)
Physician Discharge Summary  [facility] ZOX:[id number] DOB: [birth year] DOA: 02/19/2017  PCP: No primary care provider on file.  Admit date: 02/19/2017 Discharge date: 02/24/2017  Admitted From: Home Disposition:  Home  Recommendations for Outpatient Follow-up:  #1 outpatient [facility] and a referral sent. Also referral sent to [facility] in [place]. Patient does not have a PCP at present. #2 Patient will complete 10 day course of antibiotic on 10/18.Marland Kitchen  Home Health: None Equipment/Devices: None  Discharge Condition: Fair CODE STATUS: Full code Diet recommendation: Regular    Discharge Diagnoses:  Principal Problems:   Aspiration pneumonia of right lower lobe due to gastric secretions (HCC)  Active problems   Community acquired pneumonia of right lower lobe of lung (HCC)   Hypokalemia   Hyponatremia   Orthostatic hypotension   Alcoholic cirrhosis of liver with ascites (HCC)   Hypomagnesemia   Oral phase dysphagia     Malnutrition of moderate degree  Brief narrative/history of present illness 66 y.o.male,With history of liver cirrhosis, hypertension, long-standing history of alcohol abuse, quit 6 months ago came to hospital with complaints of left-sided chest pain, cough with yellow phlegm. Patient describes pain as sharp. Symptoms worse on deep breathing. He also complains of shortness of breath the ED, chest x-ray showed right pleural effusion with right basilar airspace opacity which may reflect asymmetric interstitial edema or pneumonia. Patient empirically started on Levaquin for pneumonia    Hospital course Right lobar pneumonia/aspiration pneumonia Swallowing evaluation shows patient has some oral phase dysphagia. Cannot rule out aspiration entirely. Patient was started on empiric Levaquin. Cultures have been negative. I will discharge him on oral Augmentin to complete ten-day course of antibiotics for suspected aspiration  pneumonia.  Hypokalemia/hypomagnesemia Replenished.   hyponatremia Likely secondary to liver cirrhosis. Placed on Lasix along with fluid restriction. Sodium level improving in a.m. lab.  Alcoholic liver cirrhosis with ascites. Moderate ascites on renal ultrasound. I'll obtain abdominal ultrasound to evaluate further. Added oral Lasix. Needs outpt GI referral once established PCP care.  Moderate protein -calorie malnutrition  added supplement.  Sinus tachycardia with PACs. Resolved without intervention.  BPH Having urinary hesitancy. Added Flomax. Required Foley for urine output. Now improved discontinue Foley prior to discharge.  History of orthostatic hypotension Continue midodrine.    procedure  renal US Abdominal US    Disposition:  home     Discharge Instructions   Allergies as of 02/24/2017   No Known Allergies     Medication List    TAKE these medications   amoxicillin-clavulanate 875-125 MG tablet Commonly known as:  AUGMENTIN Take 1 tablet by mouth 2 (two) times daily.   feeding supplement (GLUCERNA SHAKE) Liqd Take 237 mLs by mouth 3 (three) times daily between meals.   feeding supplement (PRO-STAT SUGAR FREE 64) Liqd Take 30 mLs by mouth 2 (two) times daily.   furosemide 40 MG tablet Commonly known as:  LASIX Take 1 tablet (40 mg total) by mouth daily.   midodrine 5 MG tablet Commonly known as:  PROAMATINE Take 5 mg by mouth 2 (two) times daily with a meal.   multivitamin with minerals Tabs tablet Take 1 tablet by mouth daily.   tamsulosin 0.4 MG Caps capsule Commonly known as:  FLOMAX Take 1 capsule (0.4 mg total) by mouth daily after supper.   thiamine 100 MG tablet Commonly known as:  VITAMIN B-1 Take 100 mg by mouth daily.      [facility] Information  [facility] [place] Follow [facility]          No Known Allergies  Consultations:     Procedures/Studies: US Renal  Result Date: 02/22/2017 CLINICAL DATA:  Oliguria.   Splenectomy. EXAM: RENAL / URINARY TRACT ULTRASOUND COMPLETE COMPARISON:  Ultrasound 12/08/2010 . FINDINGS: Right Kidney: Length: 12.0 cm. Echogenicity within normal limits. No mass or hydronephrosis visualized. Left Kidney: Length: 12.4 cm. Echogenicity within normal limits. No mass or hydronephrosis visualized. Bladder: Foley catheter noted. Ureteral jets not visualized. Ascites noted. Left pleural effusion noted. IMPRESSION: 1.  No acute renal abnormality identified.  No hydronephrosis. 2.  Prominent ascites.  Left pleural effusion noted. Electronically Signed   By: Maisie Fus  Register   On: 02/22/2017 11:14   Dg Chest Port 1 View  Result Date: 02/19/2017 CLINICAL DATA:  Acute onset of left-sided chest pain and shortness of breath. Initial encounter. EXAM: PORTABLE CHEST 1 VIEW COMPARISON:  Chest radiograph performed 07/05/2016, and CTA of the chest performed 11/16/2016 FINDINGS: A small right pleural effusion is noted, with right basilar airspace opacity. This may reflect asymmetric interstitial edema or pneumonia. The right lung is mildly hypoexpanded. No pneumothorax is seen. The cardiomediastinal silhouette is normal in size. No acute osseous abnormalities are identified. IMPRESSION: Small right pleural effusion, with right basilar airspace opacity. This may reflect asymmetric interstitial edema or pneumonia. Electronically Signed   By: Roanna Raider M.D.   On: 02/19/2017 06:32       Subjective: Feels better overall.   Discharge Exam: Vitals:   02/23/17 2228 02/24/17 0444  BP: 111/66 115/69  Pulse: (!) 101 98  Resp: 16 18  Temp: 98.9 F (37.2 C) 98.3 F (36.8 C)  SpO2: 91% 94%   Vitals:   02/23/17 1134 02/23/17 1349 02/23/17 2228 02/24/17 0444  BP:  116/79 111/66 115/69  Pulse:  (!) 107 (!) 101 98  Resp:  Temp:  98.1 F (36.7 C) 98.9 F (37.2 C) 98.3 F (36.8 C)  TempSrc:  Oral Oral Oral  SpO2: 96% 97% 91% 94%  Weight:      Height:        Gen: Elderly male not  in distress HEENT: Pallor present, no icterus, moist mucosa, supple neck Chest: Clear to auscultation bilaterally, no added sounds CVS: Normal some S2, no murmurs rub or gallop GI: Soft, Mild distention with ascites, nontender neck musculoskeletal: Warm, trace edema bilaterally CNS: Alert and oriented   The results of significant diagnostics from this hospitalization (including imaging, microbiology, ancillary and laboratory) are listed below for reference.     Microbiology: Recent Results (from the past 240 hour(s))  Culture, blood (routine x 2) Call MD if unable to obtain prior to antibiotics being given     Status: None (Preliminary result)   Collection Time: 02/19/17  1:03 PM  Result Value Ref Range Status   Specimen Description BLOOD LEFT HAND  Final   Special Requests   Final    BOTTLES DRAWN AEROBIC ONLY Blood Culture adequate volume   Culture NO GROWTH 4 DAYS  Final   Report Status PENDING  Incomplete  Culture, blood (routine x 2) Call MD if unable to obtain prior to antibiotics being given     Status: None (Preliminary result)   Collection Time: 02/19/17  1:03 PM  Result Value Ref Range Status   Specimen Description BLOOD RIGHT ARM  Final   Special Requests   Final    BOTTLES DRAWN AEROBIC AND ANAEROBIC Blood Culture adequate  volume   Culture NO GROWTH 4 DAYS  Final   Report Status PENDING  Incomplete  Culture, Urine     Status: None   Collection Time: 02/20/17  4:00 PM  Result Value Ref Range Status   Specimen Description URINE, CLEAN CATCH  Final   Special Requests Normal  Final   Culture   Final    NO GROWTH Performed at [facility], [address] [place], [place] [address]    Report Status 02/22/2017 FINAL  Final     Labs: BNP (last 3 results)  Recent Labs  07/05/16 1944  BNP 199.0*   Basic Metabolic Panel:  Recent Labs Lab 02/19/17 0614 02/19/17 0634 02/20/17 0436 02/22/17 0632 02/23/17 0458 02/24/17 0718  NA 132*  --  132* 128* 127*  129*  K 2.6*  --  3.2* 3.9 3.8 4.0  CL 99*  --  100* 100* 101 102  CO2 24  --  24 23 21* 22  GLUCOSE 108*  --  84 87 107* 87  BUN 13  --  CREATININE 0.89  --  0.81 0.66 0.66 0.69  CALCIUM 7.9*  --  7.9* 7.7* 7.7* 7.6*  MG  --  1.6*  --  1.7  --  1.8   Liver Function Tests:  Recent Labs Lab 02/19/17 0614 02/20/17 0436 02/22/17 0632 02/24/17 0718  AST ALT 9* 6* 6* 8*  ALKPHOS 96 84 73 63  BILITOT 4.2* 3.2* 2.2* 1.7*  PROT 6.8 6.0* 5.6* 5.3*  ALBUMIN 2.1* 1.8* 1.6* 1.6*   No results for input(s): LIPASE, AMYLASE in the last 168 hours.  Recent Labs Lab 02/19/17 0614  AMMONIA 22   CBC:  Recent Labs Lab 02/19/17 0614 02/20/17 0436  WBC 5.3 6.2  HGB 11.2* 10.1*  HCT 32.6* 29.5*  MCV 100.6* 102.4*  PLT 153 149*   Cardiac Enzymes: No results for input(s): CKTOTAL, CKMB, CKMBINDEX, TROPONINI in the last 168 hours. BNP: Invalid input(s): POCBNP CBG: No results for input(s): GLUCAP in the last 168 hours. D-Dimer No results for input(s): DDIMER in the last 72 hours. Hgb A1c No results for input(s): HGBA1C in the last 72 hours. Lipid Profile No results for input(s): CHOL, HDL, LDLCALC, TRIG, CHOLHDL, LDLDIRECT in the last 72 hours. Thyroid function studies No results for input(s): TSH, T4TOTAL, T3FREE, THYROIDAB in the last 72 hours.  Invalid input(s): FREET3 Anemia work up  Recent Labs  02/22/17 0632  VITAMINB12 922*  FERRITIN 92  TIBC 161*  IRON 93   Urinalysis    Component Value Date/Time   COLORURINE RED (A) 02/20/2017 1705   APPEARANCEUR CLOUDY (A) 02/20/2017 1705   LABSPEC >1.030 (H) 02/20/2017 1705   PHURINE 6.5 02/20/2017 1705   GLUCOSEU (A) 02/20/2017 1705    TEST NOT REPORTED DUE TO COLOR INTERFERENCE OF URINE PIGMENT   HGBUR (A) 02/20/2017 1705    TEST NOT REPORTED DUE TO COLOR INTERFERENCE OF URINE PIGMENT   BILIRUBINUR (A) 02/20/2017 1705    TEST NOT REPORTED DUE TO COLOR INTERFERENCE OF URINE PIGMENT    KETONESUR (A) 02/20/2017 1705    TEST NOT REPORTED DUE TO COLOR INTERFERENCE OF URINE PIGMENT   PROTEINUR (A) 02/20/2017 1705    TEST NOT REPORTED DUE TO COLOR INTERFERENCE OF URINE PIGMENT   UROBILINOGEN 0.2 04/25/2013 1500   NITRITE (A) 02/20/2017 1705    TEST NOT REPORTED DUE TO COLOR INTERFERENCE OF URINE PIGMENT  LEUKOCYTESUR (A) 02/20/2017 1705    TEST NOT REPORTED DUE TO COLOR INTERFERENCE OF URINE PIGMENT   Sepsis Labs Invalid input(s): PROCALCITONIN,  WBC,  LACTICIDVEN Microbiology Recent Results (from the past 240 hour(s))  Culture, blood (routine x 2) Call MD if unable to obtain prior to antibiotics being given     Status: None (Preliminary result)   Collection Time: 02/19/17  1:03 PM  Result Value Ref Range Status   Specimen Description BLOOD LEFT HAND  Final   Special Requests   Final    BOTTLES DRAWN AEROBIC ONLY Blood Culture adequate volume   Culture NO GROWTH 4 DAYS  Final   Report Status PENDING  Incomplete  Culture, blood (routine x 2) Call MD if unable to obtain prior to antibiotics being given     Status: None (Preliminary result)   Collection Time: 02/19/17  1:03 PM  Result Value Ref Range Status   Specimen Description BLOOD RIGHT ARM  Final   Special Requests   Final    BOTTLES DRAWN AEROBIC AND ANAEROBIC Blood Culture adequate volume   Culture NO GROWTH 4 DAYS  Final   Report Status PENDING  Incomplete  Culture, Urine     Status: None   Collection Time: 02/20/17  4:00 PM  Result Value Ref Range Status   Specimen Description URINE, CLEAN CATCH  Final   Special Requests Normal  Final   Culture   Final    NO GROWTH Performed at [facility], [address] [place], [place] [address]    Report Status 02/22/2017 FINAL  Final     Time coordinating discharge: Over 30 minutes  SIGNED:   Eddie North, MD  [facility] 02/24/2017, 11:27 AM Pager   If 7PM-7AM, please contact night-coverage [email or web address] Password [email or web address]

## 2017-02-24 NOTE — Progress Notes (Signed)
Spoke to patient, he states that he has insurance to pay for his medications and declines assistance at this time.

## 2017-02-25 NOTE — Progress Notes (Signed)
Discharge instructions and prescriptions given, verbalized understanding, out in stable condition via w/c with staff. 

## 2017-04-14 DEATH — deceased

## 2019-02-19 IMAGING — DX DG RIBS W/ CHEST 3+V*R*
5 series · 5 of 5 positions shown · non-contrast
Comparison: 05/03/2015 chest radiographs.

CLINICAL DATA: 65 y/o  M; right axillary rib pain after fall.

EXAM:
RIGHT RIBS AND CHEST - 3+ VIEW

[chest pa]
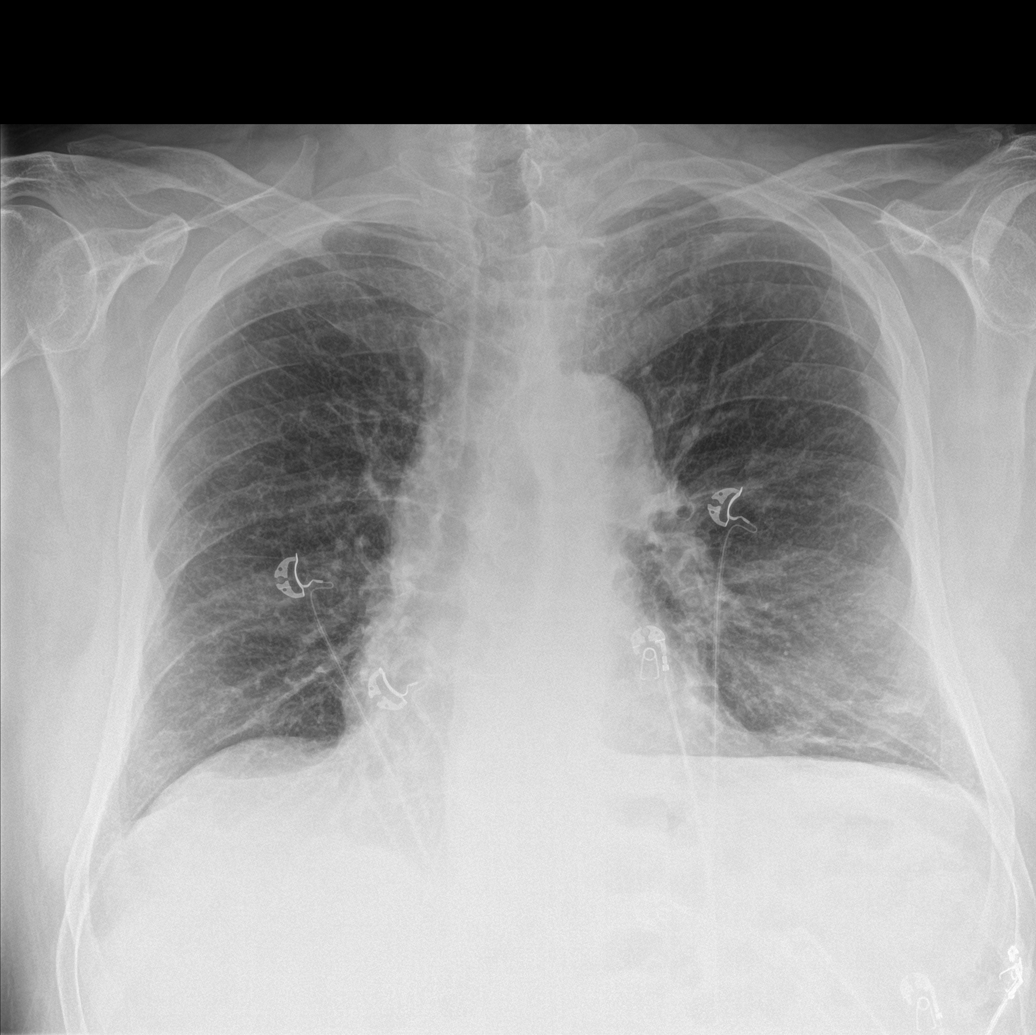

[rib pa (1 of 2)]
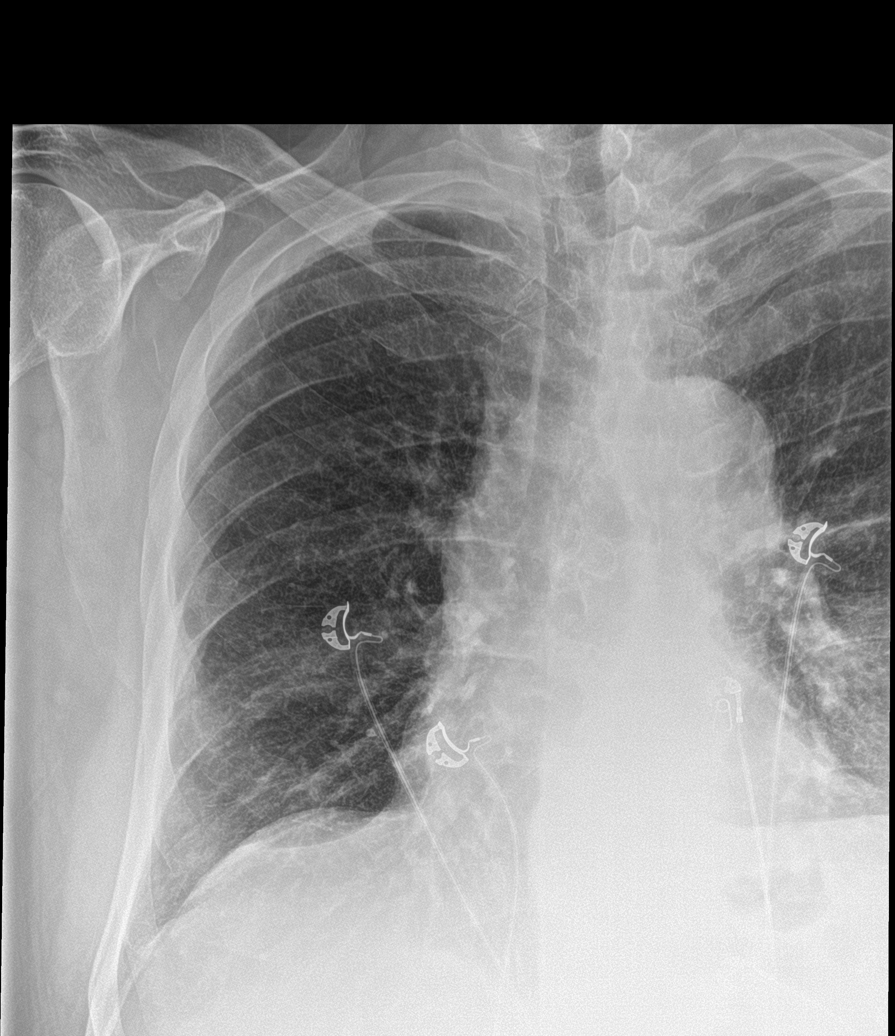

[rib pa obl (1 of 2)]
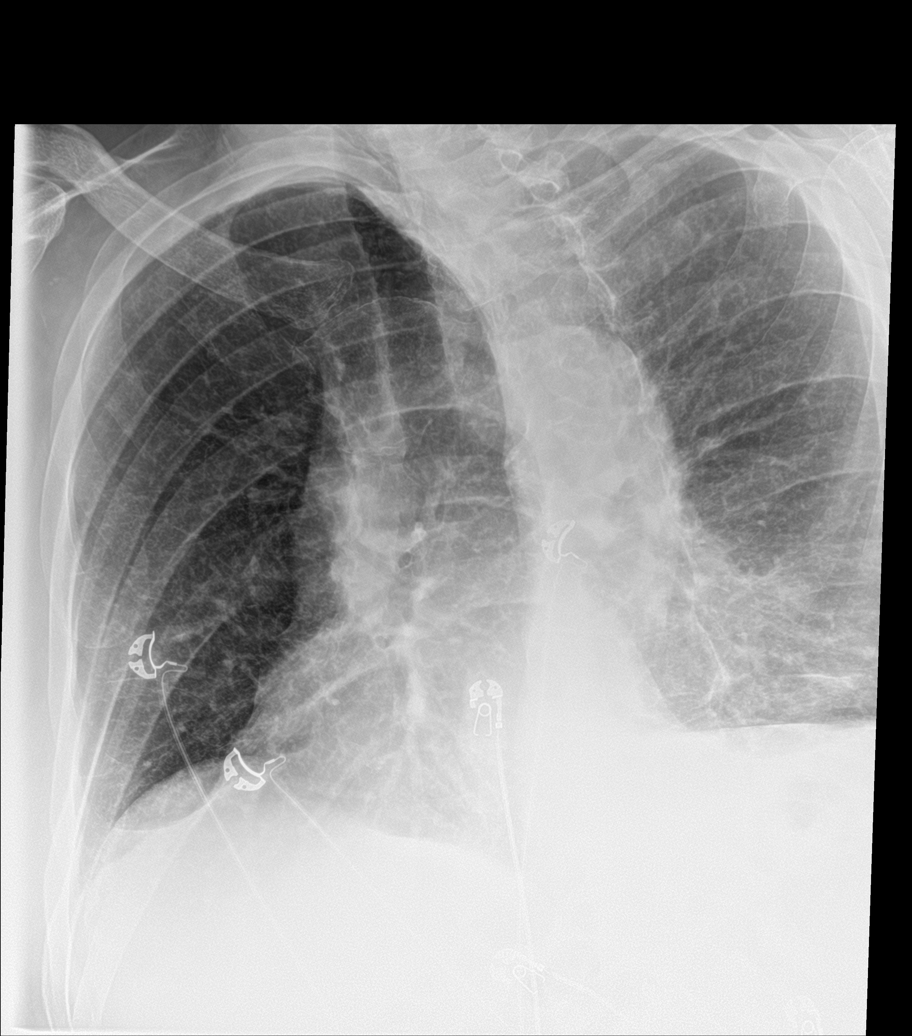

[rib pa obl (2 of 2)]
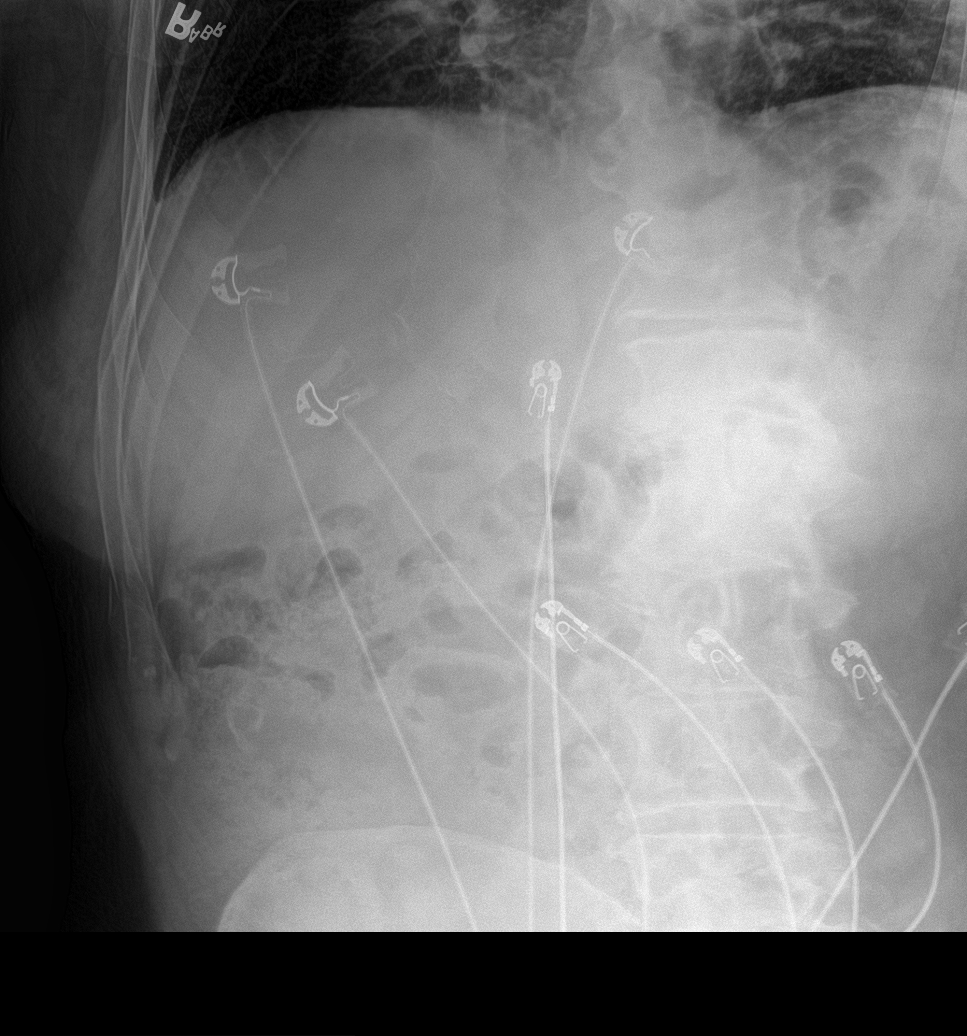

[rib pa (2 of 2)]
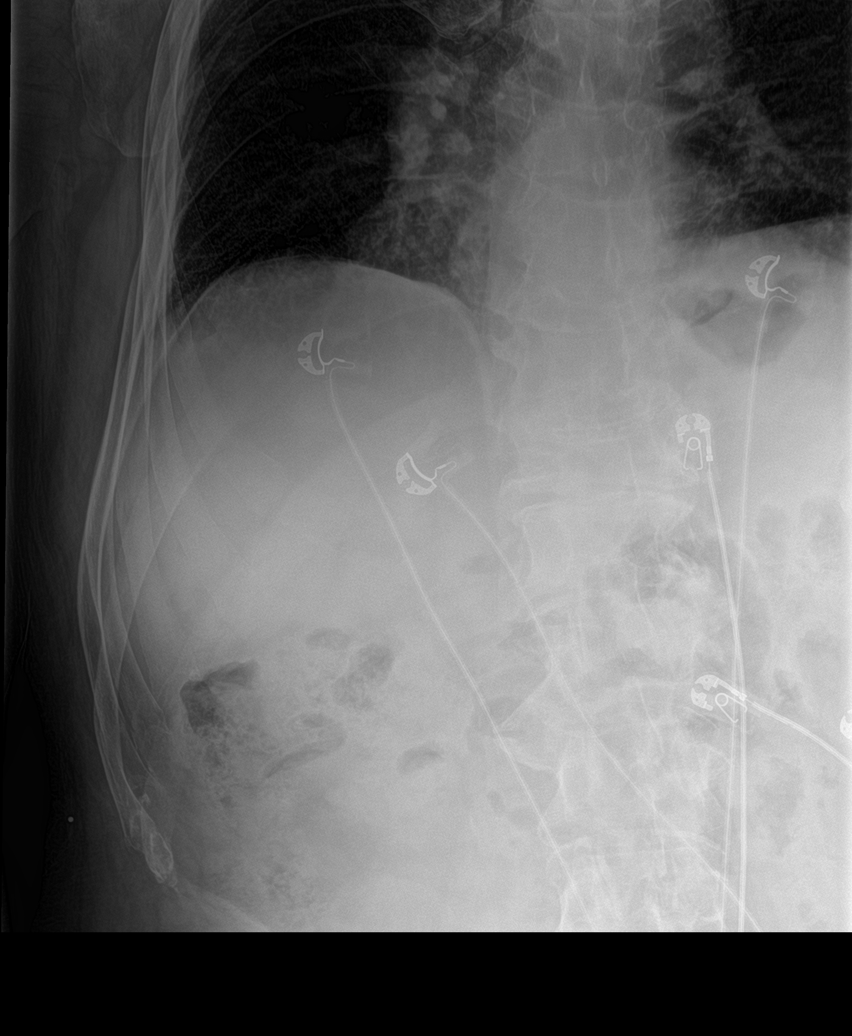

[5 of 5 positions shown; findings below may reference images not displayed]

FINDINGS: Stable cardiac silhouette. Linear opacities at left lung base
probably atelectasis or scarring. No consolidation. No pleural
effusion. No pneumothorax.

Mildly displaced right eighth and ninth anterolateral acute rib
fractures. Stable moderate chronic compression deformity of the T12
vertebral body.
IMPRESSION: Mildly displaced right eighth and ninth anterolateral acute rib
fractures. Stable moderate chronic compression deformity of the T12
vertebral body. No pneumothorax.

By: Sigisfredo Marcelo Uhlenhaut M.D.
